# Patient Record
Sex: Female | Born: 1995 | Race: White | Hispanic: No | Marital: Single
Health system: Midwestern US, Community
[De-identification: ages and names within clinical notes are randomized; demographics above are authoritative.]

## PROBLEM LIST (undated history)

## (undated) DIAGNOSIS — R55 Syncope and collapse: Secondary | ICD-10-CM

## (undated) DIAGNOSIS — Z3A33 33 weeks gestation of pregnancy: Secondary | ICD-10-CM

## (undated) DIAGNOSIS — Z3493 Encounter for supervision of normal pregnancy, unspecified, third trimester: Secondary | ICD-10-CM

## (undated) DIAGNOSIS — Z3A24 24 weeks gestation of pregnancy: Secondary | ICD-10-CM

## (undated) DIAGNOSIS — Z3A34 34 weeks gestation of pregnancy: Secondary | ICD-10-CM

## (undated) DIAGNOSIS — R42 Dizziness and giddiness: Secondary | ICD-10-CM

## (undated) DIAGNOSIS — Z3480 Encounter for supervision of other normal pregnancy, unspecified trimester: Secondary | ICD-10-CM

## (undated) DIAGNOSIS — Z3401 Encounter for supervision of normal first pregnancy, first trimester: Secondary | ICD-10-CM

## (undated) DIAGNOSIS — O26893 Other specified pregnancy related conditions, third trimester: Secondary | ICD-10-CM

---
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
## 2018-11-13 NOTE — Telephone Encounter (Signed)
Formatting of this note is different from the original.    Results and recommendations were given. Patient had no further questions or concerns. Results faxed to patients PCP.    ----- Message from Darrin Luis, MD sent 11/09/2018 -----  Please cal the patients and let them know the COVID tests are negative.Also recommend they continue to follow the 5 recommended practices. (hand washing, cough into tissue or elbow, don't touch face, social distancing and stay home if possible).    Thanks    Electronically signed by Alcide Goodness, MA at 11/13/2018  9:45 AM CDT

## 2019-04-10 NOTE — Progress Notes (Signed)
Formatting of this note is different from the original.  CHIEF COMPLAINT:  Chief Complaint   Patient presents with   ? Otalgia     right ear pain in ear and sensitivity x 1 week     SUBJECTIVE:  She presents today for R ear pain for approximately one week. Denies discharge or exudate.   Denies decrease in hearing acuity.   Admits to swimming approximately one week ago.     ROS:  All other review of systems negative, except for those noted.    PROBLEM LIST:  Patient Active Problem List   Diagnosis Code   ? 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 Major depressive disorder, recurrent episode, moderate (HCC) F33.1   ? Generalized anxiety disorder rule out Personality disorder F41.1     MEDICATIONS  Current Outpatient Medications on File Prior to Visit   Medication Sig Dispense Refill   ? methylphenidate 18 MG TB24 ER tablet Take 18 mg by mouth     ? Multiple Vitamins-Minerals (MULTIVITAMIN ADULT PO) Take by mouth daily     ? [DISCONTINUED] phentermine (ADIPEX-P) 37.5 MG tablet Take 37.5 mg by mouth       No current facility-administered medications on file prior to visit.      ALLERGIES:  No Known Allergies    PHYSICAL EXAM:    VITALS:    Vitals:    04/10/19 1028   BP: 114/74   Pulse: 53   Temp: 97.9 F (36.6 C)   TempSrc: Temporal   SpO2: 98%   Weight: (!) 226 lb 12.8 oz (102.9 kg)   Height: 5\' 7"      Constitutional: Well developed, Well nourished, mild acute distress, nontoxic appearance.   HEENT: Normocephalic, Atraumatic, PERRLA, R ear TM fullness, slight redness.   Cardiovascular: Normal heart rate   Thorax & Lungs: Normal breath sounds  Extremities: Intact distal pulses   Neurologic:  No focal deficits noted     ASSESMENT & PLAN:   Patient presents for R ear sensitivity.  No discharge or exudate.  Otitis media.  Rx. Amoxicillin 500 mg twice a day for seven days.   Return if any issues or concerns.      Final Diagnosis:  Encounter Diagnoses   Code Name Primary?   ? H66.91 Right otitis media,  unspecified otitis media type Yes     Visit Orders:  No orders of the defined types were placed in this encounter.    DISCHARGE MEDS:  Outpatient Encounter Medications as of 04/10/2019   Medication Sig Dispense Refill   ? amoxicillin (AMOXIL) 500 MG capsule Take 1 capsule (500 mg) by mouth 2 times daily for 5 days 10 capsule 0   ? amoxicillin (AMOXIL) 500 MG capsule Take 1 capsule (500 mg) by mouth 2 times daily for 7 days 14 capsule 0   ? methylphenidate 18 MG TB24 ER tablet Take 18 mg by mouth     ? Multiple Vitamins-Minerals (MULTIVITAMIN ADULT PO) Take by mouth daily     ? [DISCONTINUED] phentermine (ADIPEX-P) 37.5 MG tablet Take 37.5 mg by mouth       No facility-administered encounter medications on file as of 04/10/2019.        Electronically signed by Haskell Flirt, Ward E, DO at 04/10/2019  2:31 PM CDT
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
## 2022-05-13 NOTE — Telephone Encounter (Signed)
Formatting of this note might be different from the original.  ----- Message from Mort Sawyers, MD sent at 05/12/2022 10:07 PM CDT -----  Labs normal  Electronically signed by Nance Pew, RN at 05/13/2022  8:23 AM CDT

## 2022-08-27 ENCOUNTER — Ambulatory Visit: Admit: 2022-08-27 | Discharge: 2022-08-27 | Payer: BLUE CROSS/BLUE SHIELD | Attending: Family | Primary: Pediatrics

## 2022-08-27 DIAGNOSIS — N912 Amenorrhea, unspecified: Secondary | ICD-10-CM

## 2022-08-27 DIAGNOSIS — Z7689 Persons encountering health services in other specified circumstances: Secondary | ICD-10-CM

## 2022-08-27 NOTE — Progress Notes (Signed)
Krystal Bird is a 27 y.o. female who presents today for her medical conditions/ complaints as noted below. Krystal Bird is c/o of Confirmation and New Patient        HPI  Patient presents today as a new patient for pregnancy confirmation.   Desires Becky for delivery.   LMP 07/27/2022  First pregnancy , +UPT in office  No LMP recorded.  No obstetric history on file.    History reviewed. No pertinent past medical history.  History reviewed. No pertinent surgical history.  History reviewed. No pertinent family history.  Social History     Tobacco Use    Smoking status: Never    Smokeless tobacco: Not on file   Substance Use Topics    Alcohol use: No       No current outpatient medications on file.     No current facility-administered medications for this visit.     No Known Allergies  Vitals:    08/27/22 1450   BP: 133/87   Pulse: 53     Body mass index is 29.93 kg/m.    Review of Systems   Constitutional: Negative.    HENT: Negative.     Eyes: Negative.    Respiratory: Negative.     Cardiovascular: Negative.    Gastrointestinal: Negative.    Endocrine: Negative.    Genitourinary: Negative.  Negative for difficulty urinating, dyspareunia, dysuria, enuresis, frequency, hematuria, menstrual problem, pelvic pain, urgency and vaginal discharge.   Musculoskeletal: Negative.    Skin: Negative.    Allergic/Immunologic: Negative.    Neurological: Negative.    Hematological: Negative.    Psychiatric/Behavioral: Negative.           Physical Exam  Vitals and nursing note reviewed.   Constitutional:       General: She is not in acute distress.     Appearance: She is well-developed. She is not diaphoretic.   HENT:      Head: Normocephalic and atraumatic.   Eyes:      Conjunctiva/sclera: Conjunctivae normal.      Pupils: Pupils are equal, round, and reactive to light.   Pulmonary:      Effort: Pulmonary effort is normal.   Abdominal:      Tenderness: There is no guarding.   Musculoskeletal:         General: Normal range of  motion.      Cervical back: Normal range of motion.      Comments: Normal ROM in all 4 extremities; normal gait   Skin:     General: Skin is warm and dry.   Neurological:      Mental Status: She is alert and oriented to person, place, and time.      Motor: No abnormal muscle tone.      Coordination: Coordination normal.   Psychiatric:         Behavior: Behavior normal.           Diagnosis Orders   1. Encounter to establish care        2. Amenorrhea  POCT urine pregnancy      3. Confirm fetal cardiac activity using ultrasound  US OB TRANSVAGINAL          MEDICATIONS:  No orders of the defined types were placed in this encounter.      ORDERS:  Orders Placed This Encounter   Procedures    US OB TRANSVAGINAL    POCT urine pregnancy       PLAN:  Pregnancy recommendations discussed. Will set up [place] in about 3 weeks, call sooner if spotting or bleeding occurs.   Plan of care was discussed with patient.  Patient was encouraged to adhere to a well-balanced diet, including increasing water intake and limiting excessive caffeine and salt.  The benefits of exercise were discussed; however she was advised against heavy lifting, sit-ups and abdominal crunches.  A list of safe OTC medications was provided and discussed.  The patient was cautioned against the use of tanning beds, hot tubs, saunas, and x-rays. Avoidance of tobacco, alcohol and illicit drugs was also discussed due to harmful effects on the fetus and increased risks associated with pregnancy.  Certain labs and ultrasounds are required at certain times during pregnancy but others are optional, including the serum integrated screen/Harmony/AFP/ Panorama, and other genetic testing.  The patient was encouraged to attend childbirth classes and general hospital information was provided based on patient's hospital of choice.  RTC for nob in 4 weeks   Over 50% of the total visit time of 35 minutes was spent on counseling and/or coordination of care. All questions were answered  and the patient voiced understanding.    There are no Patient Instructions on file for this visit.

## 2022-09-13 ENCOUNTER — Inpatient Hospital Stay: Admit: 2022-09-13 | Payer: BLUE CROSS/BLUE SHIELD | Primary: Pediatrics

## 2022-09-13 ENCOUNTER — Ambulatory Visit: Payer: BLUE CROSS/BLUE SHIELD | Primary: Pediatrics

## 2022-09-13 DIAGNOSIS — Z3689 Encounter for other specified antenatal screening: Secondary | ICD-10-CM

## 2022-09-16 NOTE — Telephone Encounter (Signed)
Spoke to pt per Sherri about pt U/S looks good. There is a little hematoma that is normal to see in early pregnancy. It will go away on it's own. If she were to have bleeding or problems, let us know, Pt verbalized understanding.

## 2022-09-23 ENCOUNTER — Encounter: Admit: 2022-09-23 | Discharge: 2022-09-23 | Payer: BLUE CROSS/BLUE SHIELD | Primary: Pediatrics

## 2022-09-23 ENCOUNTER — Encounter
Admit: 2022-09-23 | Discharge: 2022-09-23 | Payer: BLUE CROSS/BLUE SHIELD | Attending: Women's Health | Primary: Pediatrics

## 2022-09-23 DIAGNOSIS — Z3A08 8 weeks gestation of pregnancy: Secondary | ICD-10-CM

## 2022-09-23 NOTE — Progress Notes (Signed)
Patient presents today for routine prenatal care. Pt denies any vaginal leaking bleeding or contractions.

## 2022-09-23 NOTE — Progress Notes (Signed)
CNM Prenatal Office Note  Subjective:  Krystal Bird is here for a return obstetrical visit. Today she is [redacted]w[redacted]d weeks EGA.  She reports the following:    Problems/complaints today:  none  Objective:  Mother's Prenatal Vitals  BP: 118/64  Weight - Scale: 88 kg (194 lb)  Pulse: 52  Patient Position: Sitting  Prenatal Fetal Information  Fetal HR: 173- u/s  Movement: Absent  Pt is A&Ox3, in no acute distress. Normocephalic, atraumatic. PERRL. Resp even and non-labored. Skin pink, warm & dry. Gravid abdomen. MAE's well. Gait steady.   Assessment:    IUP at [redacted]w[redacted]d wks      Diagnosis Orders   1. [redacted] weeks gestation of pregnancy        2. Encounter for prenatal care of first pregnancy, first trimester  Chlamydia/N. Gonorrhoeae/T.Vaginalis, Urine    Varicella Zoster Antibody, IgG    Herpes simplex virus (HSV) I/II antibodies IgG & IgM w/ reflex    Hepatitis C Antibody    HIV Obstetric Panel    Type and Screen    Culture, Urine        Plan:  Problems/Complaints Management Plan:  none  Routine OB Management Plan:  Pt counseled on  prenatal labs and Genetic testing. Planning next visit.   Continue with routine prenatal care.  RTC in 4 wks for prenatal visit      MEDICATIONS:  No orders of the defined types were placed in this encounter.    ORDERS:  Orders Placed This Encounter   Procedures    Chlamydia/N. Gonorrhoeae/T.Vaginalis, Urine    Culture, Urine    Varicella Zoster Antibody, IgG    Herpes simplex virus (HSV) I/II antibodies IgG & IgM w/ reflex    Hepatitis C Antibody    HIV Obstetric Panel    Type and Screen       More than 50% of this 20 min visit was education and counseling.

## 2022-09-23 NOTE — Progress Notes (Signed)
Patient presents today for routine prenatal care. Pt denies any vaginal leaking bleeding or contractions. + Fetal movement.

## 2022-10-04 ENCOUNTER — Encounter

## 2022-10-04 LAB — CHLAMYDIA/N. GONORRHOEAE/T. VAGINALIS, AMPLIFIED PROBE(UR)
C. trachomatis DNA ,Urine: NOT DETECTED
N. gonorrhoeae DNA, Urine: NOT DETECTED
Trichomonas vaginalis DNA, Urine: NOT DETECTED

## 2022-10-04 LAB — HEPATITIS C ANTIBODY: Hep C Ab Interp: NONREACTIVE

## 2022-10-04 LAB — TYPE AND SCREEN
ABO/Rh: A POS
Antibody Screen: NEGATIVE

## 2022-10-06 LAB — CULTURE, URINE: Urine Culture, Routine: >100000 — AB

## 2022-10-06 LAB — VARICELLA ZOSTER ANTIBODY, IGG: Varicella Zoster Ab IgG: 1.52 (ref >1.09)

## 2022-10-07 ENCOUNTER — Encounter
Admit: 2022-10-07 | Discharge: 2022-10-07 | Payer: BLUE CROSS/BLUE SHIELD | Attending: Women's Health | Primary: Pediatrics

## 2022-10-07 ENCOUNTER — Encounter: Admit: 2022-10-07 | Discharge: 2022-10-07 | Payer: BLUE CROSS/BLUE SHIELD | Primary: Pediatrics

## 2022-10-07 DIAGNOSIS — Z3A10 10 weeks gestation of pregnancy: Secondary | ICD-10-CM

## 2022-10-07 LAB — HIV OBSTETRIC PANEL
Basophils %: 0.4 % (ref 0.0–1.0)
Basophils Absolute: 0 10*3/uL (ref 0.00–0.20)
Eosinophils %: 0.9 % (ref 0.0–5.0)
Eosinophils Absolute: 0.1 10*3/uL (ref 0.00–0.60)
HIV-1 P24 Ag: NONREACTIVE
Hematocrit: 42.3 % (ref 37.0–47.0)
Hemoglobin: 14.3 g/dL (ref 12.0–16.0)
Hep B S Ag Interp: NONREACTIVE
Immature Granulocytes #: 0 10*3/uL
Lymphocytes %: 17.3 % — ABNORMAL LOW (ref 20.0–40.0)
Lymphocytes Absolute: 1.8 10*3/uL (ref 1.1–4.5)
MCH: 32.9 pg — ABNORMAL HIGH (ref 27.0–31.0)
MCHC: 33.8 g/dL (ref 33.0–37.0)
MCV: 97.2 fL (ref 81.0–99.0)
MPV: 11.2 fL (ref 9.4–12.3)
Monocytes %: 5.3 % (ref 0.0–10.0)
Monocytes Absolute: 0.6 10*3/uL (ref 0.00–0.90)
Neutrophils %: 75.7 % — ABNORMAL HIGH (ref 50.0–65.0)
Neutrophils Absolute: 8 10*3/uL — ABNORMAL HIGH (ref 1.5–7.5)
Platelets: 207 10*3/uL (ref 130–400)
RBC: 4.35 M/uL (ref 4.20–5.40)
RDW: 12.2 % (ref 11.5–14.5)
RPR: NONREACTIVE
Rapid HIV 1&2: NONREACTIVE
Rubella Antibody IgG: REACTIVE
WBC: 10.5 10*3/uL (ref 4.8–10.8)

## 2022-10-07 MED ORDER — METRONIDAZOLE 0.75 % VA GEL
0.75 | Freq: Every day | VAGINAL | 0 refills | Status: AC
Start: 2022-10-07 — End: 2022-10-12

## 2022-10-07 NOTE — Progress Notes (Unsigned)
Patient presents today for routine prenatal visit. Pt denies any vaginal leaking bleeding or contractions.   Patient desires genetic testing today

## 2022-10-07 NOTE — Patient Instructions (Signed)
Patient Education        Weeks 6 to 10 of Your Pregnancy: Care Instructions  During these weeks of pregnancy, your body goes through many changes. You may start to feel different, both in your body and your emotions. Each pregnancy is different, so there's no "right" way to feel. These early weeks are a time to make healthy choices for you and your pregnancy.    Take a daily prenatal vitamin. Choose one with folic acid in it.   Avoid alcohol, tobacco, and drugs (including marijuana). If you need help quitting, talk to your doctor.     Drink plenty of liquids.  Be sure to drink enough water. And limit sodas, other sweetened drinks, and caffeine.     Choose foods that are good sources of calcium, iron, and folate.  You can try dairy products, dark leafy greens, fortified orange juice and cereals, almonds, broccoli, dried fruit, and beans.     Avoid foods that may be harmful.  Don't eat raw meat, deli meat, raw seafood, or raw eggs. Avoid soft cheese and unpasteurized dairy, like Brie and blue cheese. And don't eat fish that contains a lot of mercury, like shark and swordfish.     Don't touch kitty litter or cat poop.  They can cause an infection that could be harmful during pregnancy.     Avoid things that can make your body too hot.  For example, avoid hot tubs and saunas.     Soothe morning sickness.  Try eating 5 or 6 small meals a day, getting some fresh air, or using ginger to control symptoms.     Ask your doctor about flu and COVID-19 shots.  Getting them can help protect against infection.   Follow-up care is a key part of your treatment and safety. Be sure to make and go to all appointments, and call your doctor if you are having problems. It's also a good idea to know your test results and keep a list of the medicines you take.  Where can you learn more?  Go to https://www.bennett.info/ and enter G112 to learn more about "Weeks 6 to 10 of Your Pregnancy: Care Instructions."  Current as of: February 23, 2022               Content Version: 13.9   2006-2023 [employer], Incorporated.   Care instructions adapted under license by [facility]. If you have questions about a medical condition or this instruction, always ask your healthcare professional. [place] any warranty or liability for your use of this information.

## 2022-10-07 NOTE — Progress Notes (Unsigned)
CNM Prenatal Office Note  Subjective:  Krystal Bird is here for a return obstetrical visit. Today she is [redacted]w[redacted]d weeks EGA.  She reports the following:    Problems/complaints today:  None  Objective:  Mother's Prenatal Vitals  BP: 113/71  Weight - Scale: 88.9 kg (196 lb)  Pulse: 51  Patient Position: Sitting  Prenatal Fetal Information  Movement: Absent  Pt is A&Ox3, in no acute distress. Normocephalic, atraumatic. PERRL. Resp even and non-labored. Skin pink, warm & dry. Gravid abdomen. MAE's well. Gait steady.   Assessment:    IUP at [redacted]w[redacted]d wks      Diagnosis Orders   1. [redacted] weeks gestation of pregnancy        2. Encounter for prenatal care of first pregnancy, first trimester          Plan:  Problems/Complaints Management Plan:  None  Routine OB Management Plan:  Pt counseled on  PNL reviewed and Genetic testing  Continue with routine prenatal care.  RTC in 4 wks for prenatal visit      MEDICATIONS:  No orders of the defined types were placed in this encounter.    ORDERS:  No orders of the defined types were placed in this encounter.      More than 50% of this 20 min visit was education and counseling.      I Edison Simon, LPN, am scribing for and in the presence of Antony Salmon, [place]  10/07/2022 10:49 AM

## 2022-10-08 LAB — HERPES SIMPLEX VIRUS (HSV) I/II ANTIBODIES IGG & IGM W/ REFLEX
Herpes Type 1/2 IgM Combined: 0.3 IV (ref <=0.89)
Herpes Type I/II IgG Combined: 0.26 IV

## 2022-11-04 ENCOUNTER — Encounter: Payer: BLUE CROSS/BLUE SHIELD | Attending: Women's Health | Primary: Pediatrics

## 2022-11-04 ENCOUNTER — Encounter
Admit: 2022-11-04 | Discharge: 2022-11-04 | Payer: BLUE CROSS/BLUE SHIELD | Attending: Women's Health | Primary: Pediatrics

## 2022-11-04 ENCOUNTER — Encounter: Admit: 2022-11-04 | Discharge: 2022-11-04 | Payer: BLUE CROSS/BLUE SHIELD | Primary: Pediatrics

## 2022-11-04 DIAGNOSIS — Z3A14 14 weeks gestation of pregnancy: Secondary | ICD-10-CM

## 2022-11-04 MED ORDER — SERTRALINE HCL 50 MG PO TABS
50 MG | ORAL_TABLET | Freq: Every day | ORAL | 5 refills | Status: AC
Start: 2022-11-04 — End: 2023-01-13

## 2022-11-04 MED ORDER — HYDROXYZINE PAMOATE 25 MG PO CAPS
25 MG | ORAL_CAPSULE | Freq: Three times a day (TID) | ORAL | 1 refills | Status: AC | PRN
Start: 2022-11-04 — End: 2022-12-04

## 2022-11-04 NOTE — Patient Instructions (Addendum)
Patient Education        Weeks 14 to 18 of Your Pregnancy: Care Instructions  Around this time, you may start to look pregnant. Your baby is now able to pass urine. And the first stool (meconium) is starting to collect in your baby's intestines. Hair is starting to grow on your baby's head.    You may notice some skin changes, such as itchy spots on your palms or acne on your face.    At your next doctor visit, you may have an ultrasound. So you might think about whether you want to know the sex of your baby. Also ask your doctor about flu and COVID-19 shots.     How to reduce stress   Ask for help when you need it.  Try to avoid things that cause you stress.  Seek out things that relieve stress, such as breathing exercises or yoga.     How to get exercise   If you don't usually exercise, start slowly. Short walks may be a good choice.  Try to be active 30 minutes a day, at least 5 days a week.  Avoid activities where you're more likely to fall.  Use light weights to reduce stress on your joints.     How to stay at a healthy weight for you   Talk to your doctor or midwife about how much weight you should gain.  It's generally best to gain:  About 28 to 40 pounds if you're underweight.  About 25 to 35 pounds if you're at a healthy weight.  About 15 to 25 pounds if you're overweight.  About 11 to 20 pounds if you're very overweight (obese).  Follow-up care is a key part of your treatment and safety. Be sure to make and go to all appointments, and call your doctor if you are having problems. It's also a good idea to know your test results and keep a list of the medicines you take.  Where can you learn more?  Go to https://www.bennett.info/ and enter I453 to learn more about "Weeks 14 to 18 of Your Pregnancy: Care Instructions."  Current as of: February 22, 2022               Content Version: 14.0   2006-2024 Healthwise, Incorporated.   Care instructions adapted under license by [facility]. If you have  questions about a medical condition or this instruction, always ask your healthcare professional. [place] any warranty or liability for your use of this information.       Patient Education        Learning About Dental Care During Pregnancy  Why is dental care important during pregnancy?  It's important to take care of your body when you are pregnant. This includes your teeth and gums. A healthy mouth--and good dental habits--will help you and your baby.  Taking care of your teeth while you are pregnant helps prevent cavities and other dental problems. Brush, floss, and try to limit sugary foods and drinks. Getting the right vitamins and nutrients is good for your baby's teeth, which begin to form before birth. And remember that it's safe--and a good idea--to visit the dentist during your pregnancy.  What changes in your mouth during pregnancy?  Some changes in your mouth during pregnancy are normal and should go away after your baby is born.  You have more blood flow to the mucous membranes of the mouth and gums when you are pregnant. This may cause bleeding  in your gums, especially when you brush your teeth.  Your gums may be more swollen and tender than usual. Try using a toothbrush with soft bristles.  Your teeth might feel a little loose. This usually does not cause any problems and goes away after pregnancy.  If you have morning sickness or digestive problems like reflux, stomach acid in your mouth can weaken your teeth. This may make them feel more sensitive and makes cavities more likely. Regular brushing can help. Keep brushing gently, even if your teeth feel more sensitive or your gums bleed.  How can you care for your teeth during pregnancy?  Keep brushing your teeth twice a day and try to floss once a day. It's fine to use toothpaste with fluoride in it while you are pregnant. Fluoride helps prevent tooth decay.  If you gag when brushing, try using a toothbrush with a smaller  head or toothpaste that doesn't foam when you brush.  Eat a balanced, nutritious diet and get the right amount of vitamins and minerals. You may have cravings for sugary snacks and drinks, but too much sugar can lead to cavities.  If you have reflux or morning sickness, rinse out your mouth. Try rinsing with a mixture of one teaspoon of baking soda in a cup of water. Wait at least 20 minutes before brushing. You might also ask your doctor if you can take over-the-counter medicine for reflux. Be safe with medicines. Read and follow all instructions on the label.  Do not smoke when you are pregnant or allow others to smoke around you. If you need help quitting, talk to your doctor about stop-smoking programs and medicines. These can increase your chances of quitting for good.  Regular visits to your dentist during pregnancy are important to prevent problems.  Tell your dentist that you are pregnant.  Dental X-rays and local anesthesia are generally safe during pregnancy.  Most dental work can be done while you are pregnant. If you go to the dentist during the second or third trimesters, you may be more comfortable sitting in a different position in the dental chair.  Delaying dental care can make a problem worse.  Follow-up care is a key part of your treatment and safety. Be sure to make and go to all appointments, and call your doctor if you are having problems. It's also a good idea to know your test results and keep a list of the medicines you take.  Where can you learn more?  Go to https://www.bennett.info/ and enter G168 to learn more about "Learning About Dental Care During Pregnancy."  Current as of: March 21, 2022               Content Version: 14.0   2006-2024 Healthwise, Incorporated.   Care instructions adapted under license by [facility]. If you have questions about a medical condition or this instruction, always ask your healthcare professional. [place] any  warranty or liability for your use of this information.

## 2022-11-04 NOTE — Progress Notes (Signed)
CNM Prenatal Office Note  Subjective:  Krystal Bird is here for a return obstetrical visit. Today she is [redacted]w[redacted]d weeks EGA.  She reports the following:    Problems/complaints today:  Anxiety  Objective:  Mother's Prenatal Vitals  BP: 137/83  Weight - Scale: 93.9 kg (207 lb)  Pulse: 57  Patient Position: Sitting  Prenatal Fetal Information  Fetal HR: 148- u/s  Movement: Absent  Pt is A&Ox3, in no acute distress. Normocephalic, atraumatic. PERRL. Resp even and non-labored. Skin pink, warm & dry. Gravid abdomen. MAE's well. Gait steady.   Assessment:    IUP at [redacted]w[redacted]d wks      Diagnosis Orders   1. [redacted] weeks gestation of pregnancy  hydrOXYzine pamoate (VISTARIL) 25 MG capsule      2. Encounter for prenatal care of first pregnancy, second trimester        3. Anxiety during pregnancy  hydrOXYzine pamoate (VISTARIL) 25 MG capsule    sertraline (ZOLOFT) 50 MG tablet        Plan:  Problems/Complaints Management Plan:  Start vistaril as needed and zoloft daily  Routine OB Management Plan:  Pt counseled on  schedule Anatomy   Continue with routine prenatal care.  RTC in 2 wks for prenatal visit and med f/u      MEDICATIONS:  Orders Placed This Encounter   Medications    hydrOXYzine pamoate (VISTARIL) 25 MG capsule     Sig: Take 1 capsule by mouth 3 times daily as needed for Itching     Dispense:  30 capsule     Refill:  1    sertraline (ZOLOFT) 50 MG tablet     Sig: Take 1 tablet by mouth daily     Dispense:  30 tablet     Refill:  5     ORDERS:  No orders of the defined types were placed in this encounter.      More than 50% of this 20 min visit was education and counseling.      I Corlis Hove, LPN, am scribing for and in the presence of Helene Shoe, [place]  11/04/22  3:51 PM CDT   I have seen and examined the patient independently.  I reviewed all laboratory and imaging studies that are relevant.  I have reviewed and made any appropriate changes to the HPI.    Electronically signed by Helene Shoe, APRN - CNM on  11/04/22  at 7:42 PM CDT

## 2022-11-04 NOTE — Progress Notes (Signed)
Pt presents today for routine prenatal visit. Pt denies vaginal bleeding, cramping, or leaking of fluid +fetal movement.

## 2022-11-18 ENCOUNTER — Inpatient Hospital Stay: Admit: 2022-11-18 | Payer: BLUE CROSS/BLUE SHIELD | Primary: Pediatrics

## 2022-11-18 ENCOUNTER — Encounter
Admit: 2022-11-18 | Discharge: 2022-11-18 | Payer: BLUE CROSS/BLUE SHIELD | Attending: Women's Health | Primary: Pediatrics

## 2022-11-18 DIAGNOSIS — Z3A16 16 weeks gestation of pregnancy: Secondary | ICD-10-CM

## 2022-11-18 MED ORDER — HYDROXYZINE HCL 10 MG PO TABS
10 | ORAL_TABLET | Freq: Every evening | ORAL | 1 refills | Status: AC | PRN
Start: 2022-11-18 — End: 2022-11-28

## 2022-11-18 NOTE — Progress Notes (Signed)
CNM Prenatal Office Note  Subjective:  Krystal Bird is here for a return obstetrical visit. Today she is [redacted]w[redacted]d weeks EGA.   She is taking her prenatal vitamins and is aware of nutrition needs. She reports the following:    Problems/complaints today:  None  Objective:  Mother's Prenatal Vitals  BP: 134/73  Weight - Scale: 93.9 kg (207 lb)  Pulse: 52  Patient Position: Sitting  Prenatal Fetal Information  Fetal HR: [place]  Movement: Absent  Pt is A&Ox3, in no acute distress. Normocephalic, atraumatic. PERRL. Resp even and non-labored. Skin pink, warm & dry. Gravid abdomen. MAE's well. Gait steady.   Assessment:    IUP at [redacted]w[redacted]d wks      Diagnosis Orders   1. [redacted] weeks gestation of pregnancy  US OB 1 OR MORE FETUS LIMITED    US OB 1 OR MORE FETUS LIMITED      2. Encounter for prenatal care of first pregnancy, second trimester  US OB 1 OR MORE FETUS LIMITED    US OB 1 OR MORE FETUS LIMITED      3. Anxiety during pregnancy  hydrOXYzine HCl (ATARAX) 10 MG tablet      4. Presence of fetal heart activity in second trimester          Plan:  Problems/complaints Management Plan:  None  Routine OB Management Plan:  Pt counseled on balanced nutrition, adequate fluid intake, taking PNV daily, and exercise along with  anatomy scheduled  Continue with routine prenatal care.  RTC in 4 wks for prenatal visit.      MEDICATIONS:  Orders Placed This Encounter   Medications    hydrOXYzine HCl (ATARAX) 10 MG tablet     Sig: Take 1 tablet by mouth nightly as needed for Itching (anxiety)     Dispense:  30 tablet     Refill:  1     ORDERS:  Orders Placed This Encounter   Procedures    US OB 1 OR MORE FETUS LIMITED       More than 50% of this 20 min visit was education and counseling.        I Edison Simon, LPN, am scribing for and in the presence of Antony Salmon, [place]  11/18/22  10:15 AM CDT .  I have seen and examined the patient independently.  I reviewed all laboratory and imaging studies that are relevant.  I have reviewed and made any  appropriate changes to the HPI.    Electronically signed by Antony Salmon, APRN - CNM on 11/19/22  at 7:52 PM CDT

## 2022-11-18 NOTE — Progress Notes (Unsigned)
Pt denies vaginal leaking, bleeding or contractions. She needs a doctors not for work about her anxiety to be able to work from home. Lower the dosage on her as needed medication. Almost passed out yesterday. Going on a cruise and needs a note stating it is safe for her to go, per the cruise line.

## 2022-11-18 NOTE — Patient Instructions (Signed)
Patient Education        Weeks 14 to 18 of Your Pregnancy: Care Instructions  Around this time, you may start to look pregnant. Your baby is now able to pass urine. And the first stool (meconium) is starting to collect in your baby's intestines. Hair is starting to grow on your baby's head.    You may notice some skin changes, such as itchy spots on your palms or acne on your face.    At your next doctor visit, you may have an ultrasound. So you might think about whether you want to know the sex of your baby. Also ask your doctor about flu and COVID-19 shots.     How to reduce stress   Ask for help when you need it.  Try to avoid things that cause you stress.  Seek out things that relieve stress, such as breathing exercises or yoga.     How to get exercise   If you don't usually exercise, start slowly. Short walks may be a good choice.  Try to be active 30 minutes a day, at least 5 days a week.  Avoid activities where you're more likely to fall.  Use light weights to reduce stress on your joints.     How to stay at a healthy weight for you   Talk to your doctor or midwife about how much weight you should gain.  It's generally best to gain:  About 28 to 40 pounds if you're underweight.  About 25 to 35 pounds if you're at a healthy weight.  About 15 to 25 pounds if you're overweight.  About 11 to 20 pounds if you're very overweight (obese).  Follow-up care is a key part of your treatment and safety. Be sure to make and go to all appointments, and call your doctor if you are having problems. It's also a good idea to know your test results and keep a list of the medicines you take.  Where can you learn more?  Go to [employer]https://www.healthwise.net/patientEd and enter I453 to learn more about "Weeks 14 to 18 of Your Pregnancy: Care Instructions."  Current as of: February 22, 2022               Content Version: 14.0   2006-2024 Healthwise, Incorporated.   Care instructions adapted under license by [facility]. If you have  questions about a medical condition or this instruction, always ask your healthcare professional. Healthwise, Incorporated disclaims any warranty or liability for your use of this information.       Patient Education        Weeks 14 to 18 of Your Pregnancy: Care Instructions  Around this time, you may start to look pregnant. Your baby is now able to pass urine. And the first stool (meconium) is starting to collect in your baby's intestines. Hair is starting to grow on your baby's head.    You may notice some skin changes, such as itchy spots on your palms or acne on your face.    At your next doctor visit, you may have an ultrasound. So you might think about whether you want to know the sex of your baby. Also ask your doctor about flu and COVID-19 shots.     How to reduce stress   Ask for help when you need it.  Try to avoid things that cause you stress.  Seek out things that relieve stress, such as breathing exercises or yoga.     How to get  exercise   If you don't usually exercise, start slowly. Short walks may be a good choice.  Try to be active 30 minutes a day, at least 5 days a week.  Avoid activities where you're more likely to fall.  Use light weights to reduce stress on your joints.     How to stay at a healthy weight for you   Talk to your doctor or midwife about how much weight you should gain.  It's generally best to gain:  About 28 to 40 pounds if you're underweight.  About 25 to 35 pounds if you're at a healthy weight.  About 15 to 25 pounds if you're overweight.  About 11 to 20 pounds if you're very overweight (obese).  Follow-up care is a key part of your treatment and safety. Be sure to make and go to all appointments, and call your doctor if you are having problems. It's also a good idea to know your test results and keep a list of the medicines you take.  Where can you learn more?  Go to [employer] and enter I453 to learn more about "Weeks 14 to 18 of Your Pregnancy: Care  Instructions."  Current as of: February 22, 2022               Content Version: 14.0   2006-2024 Healthwise, Incorporated.   Care instructions adapted under license by [facility]. If you have questions about a medical condition or this instruction, always ask your healthcare professional. Healthwise, Incorporated disclaims any warranty or liability for your use of this information.

## 2022-12-07 ENCOUNTER — Encounter

## 2022-12-07 NOTE — Unmapped (Signed)
Formatting of this note might be different from the original.  ED Provider Screening Note    HPI: Krystal G1P0 [redacted] weeks gestation presents with dizziness.   Patient reports extreme dizziness and fatigue since last night around 1800 but intermittently over the last week.   Associated nausea without vomiting. Decreased oral intake.     Denies abdominal pain, denies vaginal bleeding.     Focused PE:    Physical Exam  Vitals and nurse triage note reviewed. Patient is ambulatory.  Constitutional:       General: She is not in acute distress.     Comments: Tired in triage, resting with eyes closed. Dry mucus membranes    HENT:      Mouth/Throat:      Pharynx: No posterior oropharyngeal erythema.   Eyes:      Pupils: Pupils are equal, round, and reactive to light.   Cardiovascular:      Rate and Rhythm: Normal rate.   Pulmonary:      Effort: Pulmonary effort is normal. No respiratory distress.   Abdominal:      Tenderness: There is no abdominal tenderness.   Musculoskeletal:         General: No tenderness.   Neurological:      Mental Status: She is alert and oriented to person, place, and time.     Plan:    A brief evaluation was performed in triage to expedite medical care for the patient. Appropriate initial labs, imaging and nursing interventions will be ordered.      Electronically signed by Wesley Blas, PA at 12/07/2022  4:11 PM CDT

## 2022-12-07 NOTE — ED Triage Notes (Signed)
Formatting of this note might be different from the original.  Pt arrives complaining of dizziness/fatigue that started last night.  "I am so nauseous its been stunting my appetite."  Pt is [redacted] weeks pregnant. Resp even and non labored. Skin pwd.  Electronically signed by Gigi Gin, RN at 12/07/2022  4:07 PM CDT

## 2022-12-07 NOTE — ED Provider Notes (Signed)
Formatting of this note is different from the original.    HPI     Chief Complaint   Patient presents with    Dizziness    Pregnancy Problem     Well-appearing female with no significant past medical history is G1P0 19 weeks' gestation last ultrasound on 04/04 she had a small subchorionic hemorrhage.  She has no pregnancy-related complaints no abdominal pelvic pain no abnormal urine symptoms no cramping no flank pain.  Vaginal bleeding.  No vaginal discharge.  No abnormal urine symptoms it she is here for the past 3 weeks with the exception of just a few days she is experiencing fatigue lightheadedness nausea.  Intermittent mild headaches no chest pain no shortness a breath occasional palpitations no difficulty breathing no hemoptysis no cough no wheezing no upper respiratory symptoms.  No ear pain or drainage.  She had been taking anything for nausea there has no vomiting.  She had had any fever chills.  She follows with OB in [place].  She has been contacting them on the phone on and off.  She has no previous cardiovascular problems she has not had any syncope or near-syncope.  She does state that sometimes she "feels like I could pass out"    Dizziness  Associated symptoms: headaches and nausea    Associated symptoms: no chest pain, no diarrhea, no palpitations, no shortness of breath, no vomiting and no weakness    Pregnancy Problem  Associated symptoms: fatigue, headaches and nausea    Associated symptoms: no abdominal pain, no chest pain, no cough, no diarrhea, no fever, no shortness of breath, no vomiting and no wheezing      Signed Nursing Notes       Gigi Gin, RN 12/07/2022 16:07             Pt arrives complaining of dizziness/fatigue that started last night.  "I am so nauseous its been stunting my appetite."  Pt is [redacted] weeks pregnant. Resp even and non labored. Skin pwd.         Patient History     Past Medical History:   Diagnosis Date    Depression     GAD (generalized anxiety disorder) 11/01/2015     Obesity      No past medical history pertinent negatives.    Past Surgical History:   Procedure Laterality Date    NO PAST SURGERIES  11/06/2019     Family History   Problem Relation Age of Onset    Heart defect Sister     Cancer Maternal Grandmother     Diabetes Neg Hx      Social History     Tobacco Use    Smoking status: Former     Types: Cigarettes     Quit date: 09/2015     Years since quitting: 7.2    Smokeless tobacco: Never   Vaping Use    Vaping Use: Never used   Substance Use Topics    Alcohol use: Yes     Comment: occasional    Drug use: No      Review of Systems     Review of Systems   Constitutional:  Positive for fatigue. Negative for activity change, appetite change, chills, diaphoresis and fever.   HENT: Negative.     Eyes:  Negative for photophobia and visual disturbance.   Respiratory:  Negative for cough, choking, chest tightness, shortness of breath and wheezing.    Cardiovascular:  Negative for chest pain and palpitations.  Gastrointestinal:  Positive for nausea. Negative for abdominal pain, constipation, diarrhea and vomiting.   Genitourinary: Negative.    Musculoskeletal:  Negative for arthralgias, back pain, gait problem, neck pain and neck stiffness.   Neurological:  Positive for dizziness and headaches. Negative for seizures, syncope, speech difficulty, weakness, light-headedness and numbness.   Psychiatric/Behavioral:  Negative for agitation and confusion. The patient is not nervous/anxious.       Physical Exam     ED Triage Vitals [12/07/22 1608]   Temperature Heart Rate Resp Rate Blood Pressure Pulse Oximetry   36.5 C (97.7 F) 64 16 122/79 100 %     Temp Source Heart Rate Source Patient Position BP Location FiO2 (%)   Oral Monitor Sitting Left arm --     Physical Exam  Constitutional:       General: She is not in acute distress.     Appearance: Normal appearance. She is normal weight. She is not ill-appearing, toxic-appearing or diaphoretic.   HENT:      Head: Normocephalic and  atraumatic.      Nose: Nose normal.      Mouth/Throat:      Mouth: Mucous membranes are moist.   Eyes:      Conjunctiva/sclera: Conjunctivae normal.   Cardiovascular:      Rate and Rhythm: Normal rate and regular rhythm.   Pulmonary:      Effort: Pulmonary effort is normal.      Breath sounds: Normal breath sounds.   Abdominal:      General: There is distension.      Tenderness: There is no abdominal tenderness. There is no guarding.   Musculoskeletal:         General: No tenderness.      Right lower leg: No edema.      Left lower leg: No edema.   Skin:     Coloration: Skin is not pale.      Findings: No rash.   Neurological:      General: No focal deficit present.      Mental Status: She is alert and oriented to person, place, and time. Mental status is at baseline.      Cranial Nerves: No cranial nerve deficit.      Sensory: No sensory deficit.      Motor: No weakness.      Coordination: Coordination normal.      Gait: Gait normal.   Psychiatric:         Mood and Affect: Mood normal.         Behavior: Behavior normal.         Thought Content: Thought content normal.         Judgment: Judgment normal.      ED Course & MDM     Labs Reviewed   CBC AND DIFFERENTIAL    Narrative:     The following orders were created for panel order CBC and differential.  Procedure                               Abnormality         Status                     ---------                               -----------         ------  CBC Automated[[id number]]                                                                 Please view results for these tests on the individual orders.   CMP   MAGNESIUM   LIPASE   URINALYSIS, CULTURE IF INDICATED   CBC AUTOMATED   HCG, QUANTITATIVE       No results found.    No orders to display     EKG    (Results Pending)     Medications   ondansetron ODT (ZOFRAN-ODT) disintegrating tablet 4 mg (has no administration in time range)                 Medical Decision Making  LIGHTHEADEDNESS NAUSEA  FATIGUE AND OCCASIONAL VERY MILD HEADACHE BASICALLY FOR THE PAST 3 WEEKS OFF AND ON SHE LOOKS WELL HER WORKUP WAS REASSURING HER FETAL HEART TONES ARE AROUND 160 SHE WAS NOT SHE HAD A SLIGHTLY LOW MAGNESIUM AND CALCIUM LEVEL I DO NOT THINK THAT IS CONTRIBUTED I THINK THESE ARE JUST THE PARENTS OF 2ND TRIMESTER PREGNANCY SHE IS GOING TO FOLLOW-UP WITH OB WITHIN NEXT WEEK TO HAVE HER ELECTROLYTES RECHECKED.    Risk  OTC drugs.  Prescription drug management.    New Prescriptions    No medications on file     ED Course as of 12/07/22 1856   Tue Dec 07, 2022   1753 WELL-APPEARING PATIENT DID FETAL HEART TONES PULSE RATE ABOUT 60 THE BABY IS MOVING.  IT WAS AT BEDSIDE ULTRASOUND.  THE PATIENT HAS NO COMPLAINTS SHE LOOKS WELL HER SIGNS ABLE. [JD]   1844 Beata-hCG,Quantitative: 16,320.0 [JD]     ED Course User Index  [JD] Bard Herbert, PA       Clinical Impression: as of 12/07/22 1856   Second trimester pregnancy   Low calcium levels   Low magnesium level           Bard Herbert, [place]  12/07/22 1856    Electronically signed by Leveda Anna, MD at 12/08/2022  8:04 AM CDT

## 2022-12-10 ENCOUNTER — Encounter

## 2022-12-10 LAB — CBC WITH AUTO DIFFERENTIAL
Basophils %: 0.3 % (ref 0.0–1.0)
Basophils Absolute: 0 10*3/uL (ref 0.00–0.20)
Eosinophils %: 1.1 % (ref 0.0–5.0)
Eosinophils Absolute: 0.1 10*3/uL (ref 0.00–0.60)
Hematocrit: 38.8 % (ref 37.0–47.0)
Hemoglobin: 13.4 g/dL (ref 12.0–16.0)
Immature Granulocytes #: 0.1 10*3/uL
Lymphocytes %: 13.2 % — ABNORMAL LOW (ref 20.0–40.0)
Lymphocytes Absolute: 1.5 10*3/uL (ref 1.1–4.5)
MCH: 33.3 pg — ABNORMAL HIGH (ref 27.0–31.0)
MCHC: 34.5 g/dL (ref 33.0–37.0)
MCV: 96.3 fL (ref 81.0–99.0)
MPV: 11.2 fL (ref 9.4–12.3)
Monocytes %: 5.4 % (ref 0.0–10.0)
Monocytes Absolute: 0.6 10*3/uL (ref 0.00–0.90)
Neutrophils %: 79.2 % — ABNORMAL HIGH (ref 50.0–65.0)
Neutrophils Absolute: 9.1 10*3/uL — ABNORMAL HIGH (ref 1.5–7.5)
Platelets: 177 10*3/uL (ref 130–400)
RBC: 4.03 M/uL — ABNORMAL LOW (ref 4.20–5.40)
RDW: 12.4 % (ref 11.5–14.5)
WBC: 11.5 10*3/uL — ABNORMAL HIGH (ref 4.8–10.8)

## 2022-12-16 ENCOUNTER — Encounter
Admit: 2022-12-16 | Discharge: 2022-12-16 | Payer: BLUE CROSS/BLUE SHIELD | Attending: Women's Health | Primary: Pediatrics

## 2022-12-16 DIAGNOSIS — Z3A20 20 weeks gestation of pregnancy: Secondary | ICD-10-CM

## 2022-12-16 NOTE — Patient Instructions (Signed)
Patient Education        Weeks 18 to 22 of Your Pregnancy: Care Instructions  At this stage you may find that your nausea and fatigue are gone. You may feel better overall and have more energy. But you might now also have some new discomforts, like sleep problems or leg cramps.    You may start to feel your baby move. These movements can feel like butterflies or bubbles.    Babies at this stage can now suck their thumbs.    Get some exercise every day.  And avoid caffeine late in the day.     Take a warm shower or bath before bed.  Try relaxation exercises to calm your mind and body.     Use extra pillows.  They can help you get comfortable.     Don't use sleeping pills or alcohol.  They could harm your baby.     For leg cramps, stretch and apply heat.  A warm bath, leg warmers, a heating pad, or a hot water bottle can help with muscle aches.   Stretches for leg cramps    Straighten your leg and bend your foot (flex your ankle) slowly upward, toward your knee. Bend your toes up and down.    Stand on a flat surface. Stretch your toes upward. For balance, hold on to the wall or something stable. If it feels okay, take small steps walking on your heels.  Follow-up care is a key part of your treatment and safety. Be sure to make and go to all appointments, and call your doctor if you are having problems. It's also a good idea to know your test results and keep a list of the medicines you take.  Where can you learn more?  Go to https://www.healthwise.net/patientEd and enter W603 to learn more about "Weeks 18 to 22 of Your Pregnancy: Care Instructions."  Current as of: February 22, 2022               Content Version: 14.0   2006-2024 Healthwise, Incorporated.   Care instructions adapted under license by [facility]. If you have questions about a medical condition or this instruction, always ask your healthcare professional. Healthwise, Incorporated disclaims any warranty or liability for your use of this information.        Patient Education        Nutrition During Pregnancy: Care Instructions  Overview     Healthy eating when you are pregnant is important for you and your baby. It can help you feel well and have a successful pregnancy and delivery. During pregnancy your nutrition needs increase. Even if you have excellent eating habits, your doctor may recommend a multivitamin to make sure you get enough iron and folic acid.  You may wonder how much weight you should gain. In general, if you were at a healthy weight before you became pregnant, then you should gain between 25 and 35 pounds. If you were overweight before pregnancy, then you'll likely be advised to gain 15 to 25 pounds. If you were underweight before pregnancy, then you'll probably be advised to gain 28 to 40 pounds. Your doctor will work with you to set a weight goal that is right for you. Gaining a healthy amount of weight helps you have a healthy baby.  Follow-up care is a key part of your treatment and safety. Be sure to make and go to all appointments, and call your doctor if you are having problems. It's also a   good idea to know your test results and keep a list of the medicines you take.  How can you care for yourself at home?  Eat plenty of fruits and vegetables. Include a variety of orange, yellow, and leafy dark-green vegetables every day.  Choose whole-grain bread, cereal, and pasta. Good choices include whole wheat bread, whole wheat pasta, brown rice, and oatmeal.  Get 4 or more servings of milk and milk products each day. Good choices include nonfat or low-fat milk, yogurt, and cheese. If you cannot eat milk products, you can get calcium from calcium-fortified products such as orange juice, soy milk, and tofu. Other non-milk sources of calcium include leafy green vegetables, such as broccoli, kale, mustard greens, turnip greens, bok choy, and brussels sprouts.  If you eat meat, pick lower-fat types. Good choices include lean cuts of meat and chicken or  turkey without the skin.  Avoid fish that are high in mercury. These include shark, swordfish, king mackerel, marlin, orange roughy, and bigeye tuna, as well as tilefish from the [place].  It's okay to eat up to 8 to 12 ounces a week of fish that are low in mercury or up to 4 ounces a week of fish that have medium levels of mercury. Some fish that are low in mercury are salmon, shrimp, canned light tuna, cod, and tilapia. Some fish that have medium levels of mercury are halibut and white albacore tuna.  For more advice about eating fish, you can visit the U.S. Food and Drug Administration (FDA) or U.S. Environmental Protection Agency (EPA) website.  Heat lunch meats (such as turkey, ham, or bologna) to 165F before you eat them. This reduces your risk of getting sick from a kind of bacteria that can be found in lunch meats.  Do not eat unpasteurized soft cheeses, such as brie, feta, fresh mozzarella, and blue cheese. They have a bacteria that could harm your baby.  Limit caffeine to about 200 to 300 mg per day. On average, a cup of brewed coffee has around 80 to 100 mg of caffeine.  Do not drink any alcohol. No amount of alcohol has been found to be safe during pregnancy.  Do not diet or try to lose weight. For example, do not follow a low-carbohydrate diet. If you are overweight at the start of your pregnancy, your doctor will work with you to manage your weight gain.  Tell your doctor about all vitamins and supplements you take.  When should you call for help?  Watch closely for changes in your health, and be sure to contact your doctor if you have any problems.  Where can you learn more?  Go to https://www.healthwise.net/patientEd and enter Y785 to learn more about "Nutrition During Pregnancy: Care Instructions."  Current as of: May 05, 2022               Content Version: 14.0   2006-2024 Healthwise, Incorporated.   Care instructions adapted under license by [facility]. If you have questions about  a medical condition or this instruction, always ask your healthcare professional. Healthwise, Incorporated disclaims any warranty or liability for your use of this information.

## 2022-12-16 NOTE — Progress Notes (Signed)
Patient presents today for routine prenatal visit. Pt denies any vaginal leaking bleeding or contractions. + Fetal movement.

## 2022-12-16 NOTE — Progress Notes (Signed)
CNM Prenatal Office Note  Subjective:  Krystal Bird is here for a return obstetrical visit. Today she is [redacted]w[redacted]d weeks EGA.   She is taking her prenatal vitamins and is aware of nutrition needs. She reports the following:    Problems/complaints today:  None  Objective:  Mother's Prenatal Vitals  BP: 126/68  Weight - Scale: 95.3 kg (210 lb)  Pulse: 78  Patient Position: Sitting  Prenatal Fetal Information  Movement: Present  Pt is A&Ox3, in no acute distress. Normocephalic, atraumatic. PERRL. Resp even and non-labored. Skin pink, warm & dry. Gravid abdomen. MAE's well. Gait steady.   Assessment:    IUP at [redacted]w[redacted]d wks      Diagnosis Orders   1. [redacted] weeks gestation of pregnancy        2. Encounter for prenatal care of first pregnancy, second trimester        3. Anxiety during pregnancy          Plan:  Problems/complaints Management Plan:  None  Routine OB Management Plan:  Pt counseled on balanced nutrition, adequate fluid intake, taking PNV daily, and exercise along with  anatomy reviewed  Continue with routine prenatal care.  RTC in 4 wks for prenatal visit.      MEDICATIONS:  No orders of the defined types were placed in this encounter.    ORDERS:  No orders of the defined types were placed in this encounter.      More than 50% of this 20 min visit was education and counseling.        I Edison Simon, LPN, am scribing for and in the presence of Antony Salmon, [place]  12/16/22  11:13 AM CDT .  I have seen and examined the patient independently.  I reviewed all laboratory and imaging studies that are relevant.  I have reviewed and made any appropriate changes to the HPI.    Electronically signed by Antony Salmon, APRN - CNM on 12/16/22  at 1:07 PM CDT

## 2023-01-13 ENCOUNTER — Encounter: Payer: BLUE CROSS/BLUE SHIELD | Attending: Women's Health | Primary: Pediatrics

## 2023-01-13 ENCOUNTER — Encounter
Admit: 2023-01-13 | Discharge: 2023-01-13 | Payer: BLUE CROSS/BLUE SHIELD | Attending: Women's Health | Primary: Pediatrics

## 2023-01-13 DIAGNOSIS — Z3A24 24 weeks gestation of pregnancy: Secondary | ICD-10-CM

## 2023-01-13 MED ORDER — SERTRALINE HCL 50 MG PO TABS
50 MG | ORAL_TABLET | Freq: Two times a day (BID) | ORAL | 5 refills | Status: AC
Start: 2023-01-13 — End: 2023-09-27

## 2023-01-13 NOTE — Progress Notes (Signed)
CNM Prenatal Office Note  Subjective:  Krystal Bird is here for a return obstetrical visit. Today she is [redacted]w[redacted]d weeks EGA.   She is taking her prenatal vitamins and is aware of nutrition needs. She reports the following:    Problems/complaints today:  None  Objective:  Mother's Prenatal Vitals  BP: 120/70  Weight - Scale: 100.2 kg (221 lb)  Pulse: 51  Patient Position: Sitting  Prenatal Fetal Information  Fundal Height (cm): 24 cm  Fetal HR: 155  Movement: Present  Pt is A&Ox3, in no acute distress. Normocephalic, atraumatic. PERRL. Resp even and non-labored. Skin pink, warm & dry. Gravid abdomen. MAE's well. Gait steady.   Assessment:    IUP at [redacted]w[redacted]d wks      Diagnosis Orders   1. [redacted] weeks gestation of pregnancy        2. Encounter for prenatal care of first pregnancy, second trimester        3. Anxiety during pregnancy  sertraline (ZOLOFT) 50 MG tablet        Plan:  Problems/complaints Management Plan:  None  Routine OB Management Plan:  Pt counseled on balanced nutrition, adequate fluid intake, taking PNV daily, and exercise along with  GCT next visit  Continue with routine prenatal care.  RTC in 4 wks for prenatal visit.      MEDICATIONS:  Orders Placed This Encounter   Medications    sertraline (ZOLOFT) 50 MG tablet     Sig: Take 1 tablet by mouth in the morning and at bedtime     Dispense:  60 tablet     Refill:  5     ORDERS:  No orders of the defined types were placed in this encounter.      More than 50% of this 20 min visit was education and counseling.        I Edison Simon, LPN, am scribing for and in the presence of Antony Salmon, [place]  01/13/23  12:06 PM CDT .  I have seen and examined the patient independently.  I reviewed all laboratory and imaging studies that are relevant.  I have reviewed and made any appropriate changes to the HPI.    Electronically signed by Antony Salmon, APRN - CNM on 01/13/23  at 12:41 PM CDT

## 2023-01-13 NOTE — Progress Notes (Signed)
Pt presents today for routine prenatal visit. Pt denies vaginal bleeding, cramping, or leaking of fluid +fetal movement.

## 2023-01-13 NOTE — Patient Instructions (Signed)
Learning About When to Call Your Doctor During Pregnancy (After 20 Weeks)  Overview  It's common to have concerns about what might be a problem when you're pregnant. Most pregnancies don't have any serious problems. But it's still important to know when to call your doctor if you have certain symptoms or signs of labor.  These are general suggestions. Your doctor may give you some more information about when to call.  When to call your doctor (after 20 weeks)  Call 911  anytime you think you may need emergency care. For example, call if:  You have severe vaginal bleeding. This means you are soaking through a pad each hour for 2 or more hours.  You have sudden, severe pain in your belly.  You have chest pain, are short of breath, or cough up blood.  You passed out (lost consciousness).  You have a seizure.  You see or feel the umbilical cord.  You think you are about to deliver your baby and can't make it safely to the hospital or birthing center.  Call your doctor now or seek immediate medical care if:  You have vaginal bleeding.  You have belly pain.  You have a fever.  You are dizzy or lightheaded, or you feel like you may faint.  You have signs of a blood clot in your leg (called a deep vein thrombosis), such as:  Pain in the calf, back of the knee, thigh, or groin.  Swelling in your leg or groin.  A color change on the leg or groin. The skin may be reddish or purplish, depending on your usual skin color.  You have symptoms of preeclampsia, such as:  Sudden swelling of your face, hands, or feet.  New vision problems (such as dimness, blurring, or seeing spots).  A severe headache.  You have a sudden release of fluid from your vagina. (You think your water broke.)  You've been having regular contractions for an hour. This means that you've had at least 6 contractions within 1 hour, even after you change your position and drink fluids.  You notice that your baby has stopped moving or is moving less than  normal.  You have signs of heart failure, such as:  New or increased shortness of breath.  New or worse swelling in your legs, ankles, or feet.  Sudden weight gain, such as more than 2 to 3 pounds in a day or 5 pounds in a week.  Feeling so tired or weak that you cannot do your usual activities.  You have symptoms of a urinary tract infection. These may include:  Pain or burning when you urinate.  A frequent need to urinate without being able to pass much urine.  Pain in the flank, which is just below the rib cage and above the waist on either side of the back.  Blood in your urine.  Watch closely for changes in your health, and be sure to contact your doctor if:  You have vaginal discharge that smells bad.  You feel sad, anxious, or hopeless for more than a few days.  You have skin changes, such as a rash, itching, or a yellow color to your skin.  You have other concerns about your pregnancy.  If you have labor signs at 37 weeks or more  If you have signs of labor at 37 weeks or more, your doctor may tell you to call when your labor becomes more active. Symptoms of active labor include:  Contractions that are  regular.  Contractions that are less than 5 minutes apart.  Contractions that are hard to talk through.  Follow-up care is a key part of your treatment and safety. Be sure to make and go to all appointments, and call your doctor if you are having problems. It's also a good idea to know your test results and keep a list of the medicines you take.  Where can you learn more?  Go to [employer] and enter N531 to learn more about "Learning About When to Call Your Doctor During Pregnancy (After 20 Weeks)."  Current as of: February 22, 2022               Content Version: 14.0   2006-2024 [employer].   Care instructions adapted under license by [facility]. If you have questions about a medical condition or this instruction, always ask your healthcare professional. [employer]corpor[employer] disclaims any warranty or liability for your use of this information.         Round Ligament Pain: Care Instructions  Overview     Round ligament pain is a common pain during pregnancy. You may feel a sharp brief pain on one or both sides of your belly. It may go down into your groin. It's usually felt for the first time during the second trimester. This pain is a normal part of pregnancy.  Your uterus is supported by two ligaments that go from the top and sides of the uterus to the bones of the pelvis. These are the round ligaments. As your uterus grows, these ligaments stretch and tighten with your movements. This may be the cause of the pain. You may find that certain activities seem to cause pain. If you can, avoid those activities.  Your doctor can usually diagnose round ligament pain from your symptoms and an exam. If you have bleeding or other symptoms, your doctor may also do an imaging test, such as an ultrasound. Your doctor may suggest some things that can help the pain, such as rest and strengthening exercises.  Follow-up care is a key part of your treatment and safety. Be sure to make and go to all appointments, and call your doctor if you are having problems. It's also a good idea to know your test results and keep a list of the medicines you take.  How can you care for yourself at home?  If certain movements seem to trigger belly pain, see if you can avoid them or try moving more slowly so the ligaments don't stretch quickly.  Stay active. If your doctor says it's okay, try moderate exercise. You might try things like swimming, walking, or stretching. Ask your doctor about strengthening and stretching exercises that may help.  Try a heating pad or cold pack on the area. A warm bath or shower may also help.  Rest when you can.  Ask your doctor about taking acetaminophen for pain. Be safe with medicines. Read and follow all instructions on the label.  Try a belly support band. Some people  find that these can help.  When should you call for help?   Call your doctor now or seek immediate medical care if:    You think you might be in labor.     You have new or worse pain.   Watch closely for changes in your health, and be sure to contact your doctor if you have any problems.  Where can you learn more?  Go to [employer] and enter R110 to learn  more about "Round Ligament Pain: Care Instructions."  Current as of: February 22, 2022               Content Version: 14.0   2006-2024 [employer].   Care instructions adapted under license by [facility]. If you have questions about a medical condition or this instruction, always ask your healthcare professional. [employer] disclaims any warranty or liability for your use of this information.       During Pregnancy: Exercises  Introduction  Here are some examples of exercises to do during your pregnancy. Start each exercise slowly. Ease off the exercise if you start to have pain.  Talk to your doctor about when you can start these exercises and which ones will work best for you.  How to do the exercises  Neck rotation    Sit up straight in a firm chair, or stand up straight. If you're standing, keep your feet about hip-width apart.  Keeping your chin level, turn your head to the right and hold for 15 to 30 seconds.  Turn your head to the left and hold for 15 to 30 seconds.  Repeat 2 to 4 times.  Neck stretch to the front    Sit up straight in a firm chair, or stand up straight. Look straight ahead. If you're standing, keep your feet about hip-width apart.  Slowly bend your head forward without moving your shoulders.  Hold for 15 to 30 seconds, then return to your starting position.  Repeat 2 to 4 times.  Back press    Stand with your back 10 to 12 inches away from a wall.  Lean into the wall until your back is against it. Press your lower back against the wall by pulling in your stomach muscles.  Slowly slide down  until your knees are slightly bent, pressing your lower back against the wall.  Hold for at least 6 seconds, then slide back up the wall.  Repeat 8 to 12 times.  Over time, work up to holding this position for as much as 1 minute.  Trunk twist    Sit on the floor with your legs crossed. If that's not comfortable, you can sit on a folded blanket so your bottom is a few inches off the floor. Or you can sit on a chair with your knees hip-width apart and your feet flat on the floor.  Reach your left hand toward your right knee. You can place your right hand at your side for support.  Slowly twist your body (trunk) to your right.  Relax and return to your starting position.  Repeat 2 to 4 times.  Switch your hands and twist to your left.  Repeat 2 to 4 times.  Pelvic rocking on hands and knees    Start on your hands and knees. Place your wrists directly below your shoulders and your knees below your hips.  Breathe in slowly. Tuck your head downward and round your back up, making a curve with your back in the shape of the letter C. Hold this position for about 6 seconds.  Breathe out slowly and bring your head back up. Relax, keeping your back straight. (Don't allow it to curve toward the floor.) Hold for about 6 seconds.  Repeat 8 to 12 times, gently rocking your pelvis.  Pelvic tilt    Lie on your back with your knees bent and your feet flat on the floor.  Tighten your belly muscles by pulling your belly button  in toward your spine. Press your lower back to the floor. You should feel your hips and pelvis rock back.  Hold for 6 seconds while breathing smoothly, and then relax.  Repeat 8 to 12 times.  Do this exercise only during the first 4 months of pregnancy. After this point, lying on your back is not recommended, because it can cause blood flow problems for you and your baby.  Backward stretch    Start on your hands and knees with your knees 8 to 10 inches apart, hands directly below your shoulders, and arms and back  straight.  Keeping your arms straight, slowly lower your buttocks toward your heels and tuck your head toward your knees. Hold for 15 to 30 seconds.  Slowly return to the starting position.  Repeat 2 to 4 times.  Forward bend    Sit comfortably in a chair, with your arms relaxed.  Slowly bend forward, allowing your arms to hang down. Lean only as far as you can without feeling discomfort or pressure on your belly.  Hold for 15 to 30 seconds and then slowly sit up straight.  Repeat 2 to 4 times or to your comfort level.  [employer] on your hands and knees. Place your hands directly below your shoulders, and keep your arms straight.  Tighten your belly muscles by pulling your belly button in toward your spine. Keep breathing normally, and don't hold your breath.  Lift one knee and bring it toward your elbow.  Slowly extend that leg behind you without completely straightening it. Be careful not to let your hip drop down. Avoid arching your back.  Hold your leg behind you for about 6 seconds.  Return to your starting position.  Repeat 8 to 12 times for each leg.  Tailor sitting    Sit on the floor.  Bring your feet close to your body while crossing your ankles.  Keep your back straight. Relax your legs and let your knees drop toward the floor.  Hold this position for as long as you are comfortable.  Toe reach    Sit on the floor with your back straight, legs about 12 inches apart, and feet relaxed outward.  Stretch your hands forward toward your right foot, then sit up.  Stretch your hands straight forward, then sit up.  Stretch your hands forward toward your left foot, then sit up.  Hold each stretch for 15 to 30 seconds.  Repeat 2 to 4 times.  Follow-up care is a key part of your treatment and safety. Be sure to make and go to all appointments, and call your doctor if you are having problems. It's also a good idea to know your test results and keep a list of the medicines you take.  Current as of: February 22, 2022               Content Version: 14.0   2006-2024 [employer].   Care instructions adapted under license by [facility]. If you have questions about a medical condition or this instruction, always ask your healthcare professional. [employer] disclaims any warranty or liability for your use of this information.       Learning About Screening for Gestational Diabetes  What is gestational diabetes screening?     Screening for gestational diabetes is a way to look for high blood sugar during pregnancy. You drink some very sweet liquid. Then you have a blood test to see how your  body uses sugar (glucose).  How is gestational diabetes screening done?  Screening for gestational diabetes may be done in a couple of ways.  Two-part screening.  Part one (glucose challenge test): A blood sample is taken after you drink a liquid that contains sugar (glucose). You don't need to stop eating or drinking before this test. If the test shows that you don't have a lot of sugar in your blood, you don't have gestational diabetes.  Part two (oral glucose tolerance test, or OGTT): If the first test shows a lot of sugar in your blood, then you may have an OGTT. You can't eat or drink for at least 8 hours before this test. A blood sample is taken, then you drink a sweet liquid. You have more blood tests after 1 to 3 hours. If the OGTT shows that you have a lot of sugar in your blood, you may have gestational diabetes.  One-part screening.  Sometimes doctors use the OGTT on its own. If the test shows that you don't have a lot of sugar in your blood, you don't have gestational diabetes. If you do have a lot of sugar in your blood, you may have the condition.  What are the risks of screening?  Your blood glucose level may drop very low toward the end of the test. If this happens, you may feel weak, hungry, and restless. Tell your doctor if you have these symptoms. The test usually will be stopped.  You may vomit  after drinking the sweet liquid. If this happens, you may need to take the test at a later time.  Your doctor may do more glucose tests at other times during your pregnancy.  Follow-up care is a key part of your treatment and safety. Be sure to make and go to all appointments, and call your doctor if you are having problems. It's also a good idea to know your test results and keep a list of the medicines you take.  Where can you learn more?  Go to [employer] and enter A472 to learn more about "Learning About Screening for Gestational Diabetes."  Current as of: May 17, 2022               Content Version: 14.0   2006-2024 [employer].   Care instructions adapted under license by [facility]. If you have questions about a medical condition or this instruction, always ask your healthcare professional. [employer] disclaims any warranty or liability for your use of this information.

## 2023-01-14 ENCOUNTER — Encounter

## 2023-01-17 NOTE — Telephone Encounter (Signed)
Called pt and r/s appt for 02/08/23.

## 2023-01-17 NOTE — Telephone Encounter (Signed)
PT called to reschedule appt for ultra sound  and pre natal visit, [facility] unable to accommodate, please , call anytime, today, thanks or tomorrow,

## 2023-02-07 NOTE — Patient Instructions (Signed)
Learning About When to Call Your Doctor During Pregnancy (After 20 Weeks)  Overview  It's common to have concerns about what might be a problem when you're pregnant. Most pregnancies don't have any serious problems. But it's still important to know when to call your doctor if you have certain symptoms or signs of labor.  These are general suggestions. Your doctor may give you some more information about when to call.  When to call your doctor (after 20 weeks)  Call 911  anytime you think you may need emergency care. For example, call if:  You have severe vaginal bleeding. This means you are soaking through a pad each hour for 2 or more hours.  You have sudden, severe pain in your belly.  You have chest pain, are short of breath, or cough up blood.  You passed out (lost consciousness).  You have a seizure.  You see or feel the umbilical cord.  You think you are about to deliver your baby and can't make it safely to the hospital or birthing center.  Call your doctor now or seek immediate medical care if:  You have vaginal bleeding.  You have belly pain.  You have a fever.  You are dizzy or lightheaded, or you feel like you may faint.  You have signs of a blood clot in your leg (called a deep vein thrombosis), such as:  Pain in the calf, back of the knee, thigh, or groin.  Swelling in your leg or groin.  A color change on the leg or groin. The skin may be reddish or purplish, depending on your usual skin color.  You have symptoms of preeclampsia, such as:  Sudden swelling of your face, hands, or feet.  New vision problems (such as dimness, blurring, or seeing spots).  A severe headache.  You have a sudden release of fluid from your vagina. (You think your water broke.)  You've been having regular contractions for an hour. This means that you've had at least 6 contractions within 1 hour, even after you change your position and drink fluids.  You notice that your baby has stopped moving or is moving less than  normal.  You have signs of heart failure, such as:  New or increased shortness of breath.  New or worse swelling in your legs, ankles, or feet.  Sudden weight gain, such as more than 2 to 3 pounds in a day or 5 pounds in a week.  Feeling so tired or weak that you cannot do your usual activities.  You have symptoms of a urinary tract infection. These may include:  Pain or burning when you urinate.  A frequent need to urinate without being able to pass much urine.  Pain in the flank, which is just below the rib cage and above the waist on either side of the back.  Blood in your urine.  Watch closely for changes in your health, and be sure to contact your doctor if:  You have vaginal discharge that smells bad.  You feel sad, anxious, or hopeless for more than a few days.  You have skin changes, such as a rash, itching, or a yellow color to your skin.  You have other concerns about your pregnancy.  If you have labor signs at 37 weeks or more  If you have signs of labor at 37 weeks or more, your doctor may tell you to call when your labor becomes more active. Symptoms of active labor include:  Contractions that are   regular.  Contractions that are less than 5 minutes apart.  Contractions that are hard to talk through.  Follow-up care is a key part of your treatment and safety. Be sure to make and go to all appointments, and call your doctor if you are having problems. It's also a good idea to know your test results and keep a list of the medicines you take.  Where can you learn more?  Go to https://www.healthwise.net/patientEd and enter N531 to learn more about "Learning About When to Call Your Doctor During Pregnancy (After 20 Weeks)."  Current as of: February 22, 2022               Content Version: 14.0   2006-2024 [employer], [employer].   Care instructions adapted under license by [facility]. If you have questions about a medical condition or this instruction, always ask your healthcare professional. Healthwise,  Incorpor[employer] disclaims any warranty or liability for your use of this information.       Round Ligament Pain: Care Instructions  Overview     Round ligament pain is a common pain during pregnancy. You may feel a sharp brief pain on one or both sides of your belly. It may go down into your groin. It's usually felt for the first time during the second trimester. This pain is a normal part of pregnancy.  Your uterus is supported by two ligaments that go from the top and sides of the uterus to the bones of the pelvis. These are the round ligaments. As your uterus grows, these ligaments stretch and tighten with your movements. This may be the cause of the pain. You may find that certain activities seem to cause pain. If you can, avoid those activities.  Your doctor can usually diagnose round ligament pain from your symptoms and an exam. If you have bleeding or other symptoms, your doctor may also do an imaging test, such as an ultrasound. Your doctor may suggest some things that can help the pain, such as rest and strengthening exercises.  Follow-up care is a key part of your treatment and safety. Be sure to make and go to all appointments, and call your doctor if you are having problems. It's also a good idea to know your test results and keep a list of the medicines you take.  How can you care for yourself at home?  If certain movements seem to trigger belly pain, see if you can avoid them or try moving more slowly so the ligaments don't stretch quickly.  Stay active. If your doctor says it's okay, try moderate exercise. You might try things like swimming, walking, or stretching. Ask your doctor about strengthening and stretching exercises that may help.  Try a heating pad or cold pack on the area. A warm bath or shower may also help.  Rest when you can.  Ask your doctor about taking acetaminophen for pain. Be safe with medicines. Read and follow all instructions on the label.  Try a belly support band. Some people  find that these can help.  When should you call for help?   Call your doctor now or seek immediate medical care if:    You think you might be in labor.     You have new or worse pain.   Watch closely for changes in your health, and be sure to contact your doctor if you have any problems.  Where can you learn more?  Go to https://www.healthwise.net/patientEd and enter R110 to learn more about "  Round Ligament Pain: Care Instructions."  Current as of: February 22, 2022               Content Version: 14.0   2006-2024 [employer].   Care instructions adapted under license by [facility]. If you have questions about a medical condition or this instruction, always ask your healthcare professional. Health[employer], Inc[employer] disclaims any warranty or liability for your use of this information.       Braxton Hicks Contractions: Care Instructions  Your Care Instructions     Braxton Hicks contractions prepare your uterus for labor. Think of them as a "warm-up" exercise that your body does. You may begin to feel them between the 28th and 30th weeks of your pregnancy. But they start as early as the 20th week.  Braxton Hicks contractions usually occur more often during the ninth month. They may go away when you are active and return when you rest. These contractions are like mild contractions of true labor, but they occur less often. (You feel fewer than 8 in an hour.) They don't cause your cervix to open.  It may be hard for you to tell the difference between Braxton Hicks contractions and true labor, especially in your first pregnancy.  Follow-up care is a key part of your treatment and safety. Be sure to make and go to all appointments, and call your doctor if you are having problems. It's also a good idea to know your test results and keep a list of the medicines you take.  How can you care for yourself at home?  Try a warm bath to help relieve muscle tension and reduce pain.  Change positions every 30 minutes. Take  breaks if you must sit for a long time. Get up and walk around.  Drink plenty of water.  Taking short walks may help you feel better.  Your doctor needs to check any contractions that are getting stronger or closer together.  Where can you learn more?  Go to https://www.healthwise.net/patientEd and enter Z402 to learn more about "Braxton Hicks Contractions: Care Instructions."  Current as of: February 22, 2022               Content Version: 14.0   2006-2024 [employer].   Care instructions adapted under license by [facility]. If you have questions about a medical condition or this instruction, always ask your healthcare professional. Health[employer], Incorporated disclaims any warranty or liability for your use of this information.       Learning About Depression During Pregnancy  Who is at risk for depression during pregnancy?     If you had depression before you became pregnant, you're more likely to have it during your pregnancy. Or you may have it for the first time when you're pregnant. Depression during pregnancy may also be more likely if you have anxiety or more stress in your life or lack social support.  How do you know if you are depressed?  With all the changes in your life, you may not know if you are depressed. Pregnancy sometimes causes changes in how you feel that are similar to the symptoms of depression.  Symptoms of depression include:  Feeling sad or hopeless and losing interest in daily activities. These are the most common symptoms of depression.  Sleeping too much or not enough.  Feeling tired. You may feel as if you have no energy.  Eating too much or too little.  Having trouble focusing or making decisions.  Feeling worthless   or having suicidal thoughts.  Where to get help 24 hours a day, 7 days a week   If you or someone you know talks about suicide, self-harm, a mental health crisis, a substance use crisis, or any other kind of emotional distress, get help right away. You can:  Call  the Suicide and Crisis Lifeline at 988.  Call 1-800-273-TALK (1-800-273-8255).  Text HOME to 741741 to access the Crisis Text Line.  Consider saving these numbers in your phone.  Go to 988lifeline.org for more information or to chat online.  How is depression during pregnancy treated?  Managing depression is important for your own health and the health of your baby. Treatment options include:  Counseling. This can focus on how you feel about your pregnancy, your relationships, and changes in your life. It gives you emotional support. And it can help you solve problems and set goals. One type of counseling helps you take charge of how you think and feel. This is called cognitive behavioral therapy.  Antidepressant medicines. These medicines may improve or get rid of depression symptoms. Whether you need them depends a lot on how bad your symptoms are. Talk to your doctor about whether this medicine is right for you.  Other treatments. Some other therapies that can help include light therapy, exercise or yoga, and massage therapy.  Follow-up care is a key part of your treatment and safety. Be sure to make and go to all appointments, and call your doctor if you are having problems. It's also a good idea to know your test results and keep a list of the medicines you take.  Where can you learn more?  Go to https://www.healthwise.net/patientEd and enter F937 to learn more about "Learning About Depression During Pregnancy."  Current as of: February 22, 2022               Content Version: 14.0   2006-2024 [employer], Inc[employer].   Care instructions adapted under license by [facility]. If you have questions about a medical condition or this instruction, always ask your healthcare professional. Healthwise, Incorporated disclaims any warranty or liability for your use of this information.       Tdap (Tetanus, Diphtheria, Pertussis) Vaccine: What You Need to Know  Why get vaccinated?  Tdap vaccine can prevent tetanus,  diphtheria, and pertussis.  Diphtheria and pertussis spread from person to person. Tetanus enters the body through cuts or wounds.  TETANUS (T) causes painful stiffening of the muscles. Tetanus can lead to serious health problems, including being unable to open the mouth, having trouble swallowing and breathing, or death.  DIPHTHERIA (D) can lead to difficulty breathing, heart failure, paralysis, or death.  PERTUSSIS (aP), also known as "whooping cough," can cause uncontrollable, violent coughing that makes it hard to breathe, eat, or drink. Pertussis can be extremely serious especially in babies and young children, causing pneumonia, convulsions, brain damage, or death. In teens and adults, it can cause weight loss, loss of bladder control, passing out, and rib fractures from severe coughing.  Tdap vaccine  Tdap is only for children 7 years and older, adolescents, and adults.  Adolescents should receive a single dose of Tdap, preferably at age 11 or 12 years.  Pregnant people should get a dose of Tdap during every pregnancy, preferably during the early part of the third trimester, to help protect the newborn from pertussis. Infants are most at risk for severe, life-threatening complications from pertussis.  Adults who have never received Tdap should get a   dose of Tdap.  Also, adults should receive a booster dose of either Tdap or Td (a different vaccine that protects against tetanus and diphtheria but not pertussis) every 10 years, or after 5 years in the case of a severe or dirty wound or burn.  Tdap may be given at the same time as other vaccines.  Talk with your health care provider  Tell your vaccination provider if the person getting the vaccine:  Has had an allergic reaction after a previous dose of any vaccine that protects against tetanus, diphtheria, or pertussis, or has any severe, life-threatening allergies  Has had a coma, decreased level of consciousness, or prolonged seizures within 7 days after a  previous dose of any pertussis vaccine (DTP, DTaP, or Tdap)  Has seizures or another nervous system problem  Has ever had Guillain-Barr Syndrome (also called "GBS")  Has had severe pain or swelling after a previous dose of any vaccine that protects against tetanus or diphtheria  In some cases, your health care provider may decide to postpone Tdap vaccination until a future visit.  People with minor illnesses, such as a cold, may be vaccinated. People who are moderately or severely ill should usually wait until they recover before getting Tdap vaccine.  Your health care provider can give you more information.  Risks of a vaccine reaction  Pain, redness, or swelling where the shot was given, mild fever, headache, feeling tired, and nausea, vomiting, diarrhea, or stomachache sometimes happen after Tdap vaccination.  People sometimes faint after medical procedures, including vaccination. Tell your provider if you feel dizzy or have vision changes or ringing in the ears.  As with any medicine, there is a very remote chance of a vaccine causing a severe allergic reaction, other serious injury, or death.  What if there is a serious problem?  An allergic reaction could occur after the vaccinated person leaves the clinic. If you see signs of a severe allergic reaction (hives, swelling of the face and throat, difficulty breathing, a fast heartbeat, dizziness, or weakness), call 9-1-1 and get the person to the nearest hospital.  For other signs that concern you, call your health care provider.  Adverse reactions should be reported to the Vaccine Adverse Event Reporting System (VAERS). Your health care provider will usually file this report, or you can do it yourself. Visit the VAERS website at www.vaers.hhs.gov or call 1-800-822-7967. VAERS is only for reporting reactions, and VAERS staff members do not give medical advice.  The National Vaccine Injury Compensation Program  The National Vaccine Injury Compensation Program  (VICP) is a federal program that was created to compensate people who may have been injured by certain vaccines. Claims regarding alleged injury or death due to vaccination have a time limit for filing, which may be as short as two years. Visit the VICP website at www.hrsa.gov/vaccinecompensation or call 1-800-338-2382 to learn about the program and about filing a claim.  How can I learn more?  Ask your health care provider.  Call your local or state health department.  Visit the website of the Food and Drug Administration (FDA) for vaccine package inserts and additional information at www.fda.gov/vaccines-blood-biologics/vaccines.  Contact the Centers for Disease Control and Prevention (CDC):  Call 1-800-232-4636 (1-800-CDC-INFO) or  Visit CDC's website at www.cdc.gov/vaccines.  Vaccine Information Statement  Tdap (Tetanus, Diphtheria, Pertussis) Vaccine  03/21/2020  42 [place]  [id number]  Department of Health and Human Services  Centers for Disease Control and Prevention  Many vaccine information statements are available   in Spanish and other languages. See www.immunize.org/vis  Hojas de informacin sobre vacunas estn disponibles en espaol y en muchos otros idiomas. Visite www.immunize.org/vis  Care instructions adapted under license by [facility]. If you have questions about a medical condition or this instruction, always ask your healthcare professional. Healthwise, Incorporated disclaims any warranty or liability for your use of this information.

## 2023-02-08 ENCOUNTER — Encounter

## 2023-02-08 ENCOUNTER — Encounter
Admit: 2023-02-08 | Discharge: 2023-02-08 | Payer: BLUE CROSS/BLUE SHIELD | Attending: Women's Health | Primary: Pediatrics

## 2023-02-08 ENCOUNTER — Ambulatory Visit: Admit: 2023-02-08 | Discharge: 2023-02-08 | Payer: BLUE CROSS/BLUE SHIELD | Primary: Pediatrics

## 2023-02-08 DIAGNOSIS — Z3A28 28 weeks gestation of pregnancy: Secondary | ICD-10-CM

## 2023-02-08 LAB — CBC
Hematocrit: 37.2 % (ref 37.0–47.0)
Hemoglobin: 12.6 g/dL (ref 12.0–16.0)
MCH: 33.1 pg — ABNORMAL HIGH (ref 27.0–31.0)
MCHC: 33.9 g/dL (ref 33.0–37.0)
MCV: 97.6 fL (ref 81.0–99.0)
MPV: 10.6 fL (ref 9.4–12.3)
Platelets: 194 10*3/uL (ref 130–400)
RBC: 3.81 M/uL — ABNORMAL LOW (ref 4.20–5.40)
RDW: 12.1 % (ref 11.5–14.5)
WBC: 12.2 10*3/uL — ABNORMAL HIGH (ref 4.8–10.8)

## 2023-02-08 LAB — GLUCOSE 1 HOUR POSTPRANDIAL: Glucose, 1Hr PP: 127 mg/dL (ref 75–140)

## 2023-02-08 MED ORDER — PANTOPRAZOLE SODIUM 20 MG PO TBEC
20 | ORAL_TABLET | Freq: Every day | ORAL | 5 refills | Status: DC
Start: 2023-02-08 — End: 2023-03-16

## 2023-02-08 NOTE — Progress Notes (Signed)
Pt presents today for routine prenatal visit. Pt denies vaginal bleeding, cramping, or leaking of fluid +fetal movement.

## 2023-02-08 NOTE — Progress Notes (Unsigned)
3d complete today

## 2023-02-08 NOTE — Progress Notes (Signed)
CNM Prenatal Office Note  Subjective:  Krystal Bird is here for a return obstetrical visit. Today she is [redacted]w[redacted]d weeks EGA.  Pt does feel fetal movement regularly. Denies headaches, RUQ pain, or visual changes as well as contractions, vaginal bleeding or leaking of fluid. 1 hour GCT today. Rhogam not indicated.   Problems/Complaints today:  Heartburn  Objective:  Mother's Prenatal Vitals  BP: 113/68  Weight - Scale: 102.5 kg (226 lb)  Pulse: 58  Patient Position: Sitting  Prenatal Fetal Information  Fundal Height (cm): 29 cm  Fetal HR: [place]  Movement: Present  Pt is A&Ox3, in no acute distress. Normocephalic, atraumatic. PERRL. Resp even and non-labored. Skin pink, warm & dry. Gravid abdomen. MAE's well. Gait steady. EPDS Screening: 8/30  Assessment:    IUP at [redacted]w[redacted]d wks      Diagnosis Orders   1. 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 Encounter for prenatal care of first pregnancy, third trimester  PR EVAL SELF-ASSESS DEPRESSION      3. Encounter for screening for maternal depression  PR EVAL SELF-ASSESS DEPRESSION      4. Heartburn during pregnancy in third trimester  pantoprazole (PROTONIX) 20 MG tablet        Plan:  Problems/Complaints Management Plan:  Protonix sent in  Routine OB Management Plan:  Third trimester teaching completed including warning signs for pre-eclampsia (blurred vision, seeing spots/sparkles, sudden increased weight gain or profound edema, epigastric pain), FKC (decreased fetal movments), preterm labor (preterm contractions or watery discharge, vaginal bleeding, cramping), n/v, chills, and or fever. Instructed pt to come to office or go to LDR if these symptoms presented. Pt voiced understanding.   Pt counseled on balanced nutrition, adequate fluid intake, taking PNV daily, and exercise along with GHTN precautions, Kick count, and Preterm labor  Continue with routine prenatal care.  RTC in 2 wks for prenatal  visit      MEDICATIONS:  Orders Placed This Encounter   Medications    pantoprazole (PROTONIX) 20 MG tablet     Sig: Take 1 tablet by mouth every morning (before breakfast)     Dispense:  30 tablet     Refill:  5     ORDERS:  Orders Placed This Encounter   Procedures    PR EVAL SELF-ASSESS DEPRESSION       More than 50% of this 20 min visit was education and counseling.        I Edison Simon, LPN, am scribing for and in the presence of Antony Salmon, [place]  02/08/23  9:05 AM CDT   I have seen and examined the patient independently.  I reviewed all laboratory and imaging studies that are relevant.  I have reviewed and made any appropriate changes to the HPI.    Electronically signed by Antony Salmon, APRN - CNM on 02/08/23  at 9:33 AM CDT

## 2023-02-10 ENCOUNTER — Encounter: Payer: BLUE CROSS/BLUE SHIELD | Attending: Women's Health | Primary: Pediatrics

## 2023-02-10 ENCOUNTER — Encounter: Payer: BLUE CROSS/BLUE SHIELD | Primary: Pediatrics

## 2023-02-10 LAB — RPR: RPR: NONREACTIVE

## 2023-02-21 ENCOUNTER — Emergency Department: Admit: 2023-02-21 | Payer: BLUE CROSS/BLUE SHIELD

## 2023-02-21 ENCOUNTER — Inpatient Hospital Stay
Admit: 2023-02-21 | Discharge: 2023-02-21 | Disposition: A | Discharge status: Home / Self Care | Payer: BLUE CROSS/BLUE SHIELD

## 2023-02-21 DIAGNOSIS — O99891 Other specified diseases and conditions complicating pregnancy: Secondary | ICD-10-CM

## 2023-02-21 LAB — CBC WITH AUTO DIFFERENTIAL
Basophils %: 0.4 % (ref 0.0–1.0)
Basophils Absolute: 0.1 10*3/uL (ref 0.00–0.20)
Eosinophils %: 1.2 % (ref 0.0–5.0)
Eosinophils Absolute: 0.2 10*3/uL (ref 0.00–0.60)
Hematocrit: 36.5 % — ABNORMAL LOW (ref 37.0–47.0)
Hemoglobin: 12.7 g/dL (ref 12.0–16.0)
Immature Granulocytes #: 0.2 10*3/uL
Lymphocytes %: 13.1 % — ABNORMAL LOW (ref 20.0–40.0)
Lymphocytes Absolute: 1.7 10*3/uL (ref 1.1–4.5)
MCH: 33.3 pg — ABNORMAL HIGH (ref 27.0–31.0)
MCHC: 34.8 g/dL (ref 33.0–37.0)
MCV: 95.8 fL (ref 81.0–99.0)
MPV: 10.5 fL (ref 9.4–12.3)
Monocytes %: 6.1 % (ref 0.0–10.0)
Monocytes Absolute: 0.8 10*3/uL (ref 0.00–0.90)
Neutrophils %: 77.7 % — ABNORMAL HIGH (ref 50.0–65.0)
Neutrophils Absolute: 10.1 10*3/uL — ABNORMAL HIGH (ref 1.5–7.5)
Platelets: 172 10*3/uL (ref 130–400)
RBC: 3.81 M/uL — ABNORMAL LOW (ref 4.20–5.40)
RDW: 11.9 % (ref 11.5–14.5)
WBC: 13 10*3/uL — ABNORMAL HIGH (ref 4.8–10.8)

## 2023-02-21 LAB — COMPREHENSIVE METABOLIC PANEL
ALT: 14 U/L (ref 5–33)
AST: 15 U/L (ref 5–32)
Albumin: 3.5 g/dL (ref 3.5–5.2)
Alkaline Phosphatase: 86 U/L (ref 35–104)
Anion Gap: 13 mmol/L (ref 7–19)
BUN: 7 mg/dL (ref 6–20)
CO2: 19 mmol/L — ABNORMAL LOW (ref 22–29)
Calcium: 8.4 mg/dL — ABNORMAL LOW (ref 8.6–10.0)
Chloride: 105 mmol/L (ref 98–111)
Creatinine: 0.4 mg/dL — ABNORMAL LOW (ref 0.5–0.9)
Est, Glom Filt Rate: >90 (ref >60)
Glucose: 81 mg/dL (ref 74–109)
Potassium: 4.1 mmol/L (ref 3.5–5.0)
Sodium: 137 mmol/L (ref 136–145)
Total Bilirubin: 0.2 mg/dL (ref 0.2–1.2)
Total Protein: 6.3 g/dL — ABNORMAL LOW (ref 6.6–8.7)

## 2023-02-21 MED ORDER — SODIUM CHLORIDE 0.9 % IV BOLUS
0.9 | Freq: Once | INTRAVENOUS | Status: AC
Start: 2023-02-21 — End: 2023-02-21
  Administered 2023-02-21: 14:00:00 500 mL via INTRAVENOUS

## 2023-02-21 MED ORDER — ONDANSETRON HCL 4 MG/2ML IJ SOLN
4 | Freq: Once | INTRAMUSCULAR | Status: AC
Start: 2023-02-21 — End: 2023-02-21
  Administered 2023-02-21: 14:00:00 4 mg via INTRAVENOUS

## 2023-02-21 MED ORDER — ACETAMINOPHEN 500 MG PO TABS
500 | Freq: Once | ORAL | Status: AC
Start: 2023-02-21 — End: 2023-02-21
  Administered 2023-02-21: 15:00:00 1000 mg via ORAL

## 2023-02-21 MED FILL — TYLENOL EXTRA STRENGTH 500 MG PO TABS: 500 MG | ORAL | Qty: 2

## 2023-02-21 MED FILL — ONDANSETRON HCL 4 MG/2ML IJ SOLN: 4 MG/2ML | INTRAMUSCULAR | Qty: 2

## 2023-02-21 NOTE — ED Notes (Signed)
U/s at bedside

## 2023-02-21 NOTE — Discharge Instructions (Addendum)
Rest today and tomorrow.  Follow up with OB as scheduled.  Return to ER for any new, worsening, or change in condition.   Be sure you are drinking plenty of water.

## 2023-02-21 NOTE — ED Provider Notes (Addendum)
[facility] EMERGENCY DEPT  EMERGENCY DEPARTMENT ENCOUNTER      Pt Name: Krystal Bird  MRN: [id number]  Birthdate May 05, 1996  Date of evaluation: 02/21/2023  Provider: Lowella Dell, APRN - NP    CHIEF COMPLAINT       Chief Complaint   Patient presents with    Loss of Consciousness     Patient is [redacted] weeks pregnant, had some nausea/vomiting this morning; then had syncopal episode while at work, did not fall, could feel it happening         HISTORY OF PRESENT ILLNESS   (Location/Symptom, Timing/Onset,Context/Setting, Quality, Duration, Modifying Factors, Severity)  Note limiting factors.   Krystal Bird is a 27 y.o. female who presents to the emergency department with report of "passing out" episode at work this morning.  She was attending a meeting and felt nauseous and lightheaded and as she was trying to leave the meeting she felt worse and slid down a wall into a seated position on the floor.  She denies striking her head or any trauma from the incident.  She is [redacted] weeks pregnant and reports similar episodes, "although not this bad" in the past.  This is her first pregnancy.  She denies any urinary symptoms or upper respiratory symptoms or any other recent illness.              NursingNotes were reviewed.    REVIEW OF SYSTEMS    (2-9 systems for level 4, 10 or more for level 5)     Review of Systems   Constitutional:  Positive for fatigue.        Gravid abdomen   HENT: Negative.     Respiratory: Negative.     Cardiovascular: Negative.    Gastrointestinal:  Positive for nausea and vomiting. Negative for abdominal pain.   Genitourinary:  Negative for difficulty urinating, dysuria, hematuria, vaginal bleeding and vaginal discharge.   Musculoskeletal: Negative.    Neurological:  Positive for syncope and light-headedness.       A complete review of systems was performed and is negative except as noted above in the HPI.       PAST MEDICAL HISTORY   History reviewed. No pertinent past medical history.      SURGICAL HISTORY      History reviewed. No pertinent surgical history.      CURRENT MEDICATIONS       Discharge Medication List as of 02/21/2023 11:51 AM        CONTINUE these medications which have NOT CHANGED    Details   pantoprazole (PROTONIX) 20 MG tablet Take 1 tablet by mouth every morning (before breakfast), Disp-30 tablet, R-5Normal      sertraline (ZOLOFT) 50 MG tablet Take 1 tablet by mouth in the morning and at bedtime, Disp-60 tablet, R-5Normal      ferrous sulfate (FE TABS 325) 325 (65 Fe) MG EC tablet Take 1 tablet by mouth every other dayHistorical Med      Prenat-B2-B6-B12-D3-FA (PRENA1 PO) Take by mouthHistorical Med             ALLERGIES     Patient has no known allergies.    FAMILY HISTORY     History reviewed. No pertinent family history.       SOCIAL HISTORY       Social History     Socioeconomic History    Marital status: Life Partner     Spouse name: None    Number of children: None  Years of education: None    Highest education level: None   Tobacco Use    Smoking status: Never   Substance and Sexual Activity    Alcohol use: No    Drug use: No       SCREENINGS    Glasgow Coma Scale  Eye Opening: Spontaneous  Best Verbal Response: Oriented  Best Motor Response: Obeys commands  Glasgow Coma Scale Score: 15        PHYSICAL EXAM    (up to 7 for level 4, 8 or more for level 5)     ED Triage Vitals [02/21/23 0919]   BP Temp Temp src Pulse Respirations SpO2 Height Weight   108/66 98.2 F (36.8 C) -- 67 18 98 % -- --       Physical Exam  Vitals and nursing note reviewed.   Constitutional:       Appearance: Normal appearance. She is normal weight.      Comments: Gravid abdomen; OB nurse at bedside attaching toco monitor, fetal heart rate 134.   HENT:      Head: Normocephalic and atraumatic.      Nose: Nose normal.      Mouth/Throat:      Mouth: Mucous membranes are dry.   Eyes:      Pupils: Pupils are equal, round, and reactive to light.   Cardiovascular:      Rate and Rhythm: Normal rate and regular rhythm.    Pulmonary:      Effort: Pulmonary effort is normal.      Breath sounds: Normal breath sounds.   Abdominal:      General: Bowel sounds are normal.      Tenderness: There is no abdominal tenderness.   Musculoskeletal:         General: Normal range of motion.      Cervical back: Normal range of motion and neck supple.   Skin:     General: Skin is warm and dry.      Capillary Refill: Capillary refill takes less than 2 seconds.   Neurological:      General: No focal deficit present.      Mental Status: She is alert and oriented to person, place, and time.         DIAGNOSTIC RESULTS     EKG: All EKG's are interpreted by the Emergency Department Physician who either signs or Co-signs this chart in the absence of a cardiologist.    Ectopic atrial rhythm with rate of 62, PRI 108, QRS 89, QTc 423, axis within normal limits; no prior EKG for comparison.    RADIOLOGY:   Non-plain film images such as CT, Ultrasound and MRI are read by the radiologist. Plainradiographic images are visualized and preliminarily interpreted by the emergency physician with the below findings:        Interpretation per the Radiologist below, if available at the time of this note:    US FETAL BIOPHYSICAL PROFILE WO NON STRESS TESTING   Final Result   Biophysical profile score 8/8.           ______________________________________    Electronically signed by: Al Pimple M.D.   Date:     02/21/2023   Time:    11:47             ED BEDSIDE ULTRASOUND:   Performed by ED Physician - none    LABS:  Labs Reviewed   CBC WITH AUTO DIFFERENTIAL - Abnormal; Notable for the following components:  Result Value    WBC 13.0 (*)     RBC 3.81 (*)     Hematocrit 36.5 (*)     MCH 33.3 (*)     Neutrophils % 77.7 (*)     Lymphocytes % 13.1 (*)     Neutrophils Absolute 10.1 (*)     All other components within normal limits   COMPREHENSIVE METABOLIC PANEL - Abnormal; Notable for the following components:    CO2 19 (*)     Creatinine 0.4 (*)     Calcium 8.4 (*)      Total Protein 6.3 (*)     All other components within normal limits   URINALYSIS WITH REFLEX TO CULTURE       All other labs were within normal range or not returned as of this dictation.    Medications   sodium chloride 0.9 % bolus 500 mL (0 mLs IntraVENous Stopped 02/21/23 1135)   ondansetron (ZOFRAN) injection 4 mg (4 mg IntraVENous Given 02/21/23 0934)   acetaminophen (TYLENOL) tablet 1,000 mg (1,000 mg Oral Given 02/21/23 1059)       EMERGENCY DEPARTMENT COURSE and DIFFERENTIALDIAGNOSIS/MDM:   Vitals:    Vitals:    02/21/23 0919 02/21/23 0949 02/21/23 1004 02/21/23 1130   BP: 108/66 106/67 101/62 103/63   Pulse: 67 66 64 66   Resp: 18 18 18 16    Temp: 98.2 F (36.8 C)      SpO2: 98% 96% 96% 96%       MDM  Number of Diagnoses or Management Options  Syncope and collapse  Diagnosis management comments: 27 year old well-appearing female who is [redacted] weeks pregnant presented to the emergency department after syncopal episode at her place of employment.  She states that she leaned against the wall and slid to the floor.  She has had a similar episode in the past.  CBC and CMP are unremarkable and EKG with an atrial rhythm with rate of 67, vital signs otherwise stable for 30-week gestation.  Patient complained of headache and Tylenol administered with improvement in symptoms.  NST performed by [facility] nurses and discussed patient findings with patient's OB practitioner who is agreeable to discharge from her perspective.  Patient is accompanied by her spouse and will be discharged home.        ED Course as of 02/21/23 1232   Mon Feb 21, 2023   1101 Spoke with patient's OB provider, Tyrone Sage, who requests NST.  Will order and send patient to [facility] for the test. [TG]      ED Course User Index  [TG] Lowella Dell, APRN - NP       CONSULTS:  None    PROCEDURES:  Unless otherwise notedbelow, none     Procedures      FINAL IMPRESSION     1. Syncope and collapse          DISPOSITION/PLAN   DISPOSITION Decision To Discharge  02/21/2023 11:46:15 AM      No notes of EC Admission Criteria type on file.    PATIENT REFERRED TO:  No follow-up provider specified.    DISCHARGE MEDICATIONS:  Discharge Medication List as of 02/21/2023 11:51 AM             (Please note that portions of this note were completed with a voice recognition program.  Efforts were made to edit the dictations butoccasionally words are mis-transcribed.)    Lowella Dell, APRN - NP (electronically signed)  AttendingEmergency Physician  Lowella Dell, APRN - NP  02/21/23 1153       Lowella Dell, APRN - NP  02/21/23 [phone]

## 2023-02-21 NOTE — Progress Notes (Signed)
Provider ordered NST, read and patient is reactive. Patient is removed from monitor and discharged from [facility].

## 2023-02-21 NOTE — Other (Signed)
OB RN to bedside with portable fetal monitor, pt denies leaking of fluid, denies vaginal bleeding, denies contractions, + fetal movement, TOCO and EFM placed to soft non-tender abd.

## 2023-02-21 NOTE — ED Notes (Addendum)
Paged Dr. Braulio Bosch, OB/GYN for Marcy Salvo and texted Dr. Laural Benes

## 2023-02-22 LAB — EKG 12-LEAD
P Axis: -77 degrees
P-R Interval: 108 ms
Q-T Interval: 422 ms
QRS Duration: 78 ms
QTc Calculation (Bazett): 427 ms
T Axis: 10 degrees

## 2023-02-23 ENCOUNTER — Encounter: Admit: 2023-02-23 | Discharge: 2023-02-23 | Payer: BLUE CROSS/BLUE SHIELD | Attending: Women's Health

## 2023-02-23 DIAGNOSIS — Z3A30 30 weeks gestation of pregnancy: Secondary | ICD-10-CM

## 2023-02-23 NOTE — Progress Notes (Signed)
CNM Prenatal Office Note  Subjective:  Krystal Bird is here for a return obstetrical visit. Today she is [redacted]w[redacted]d weeks EGA.  Pt does feel fetal movement regularly. Denies headaches, RUQ pain, or visual changes as well as contractions, vaginal bleeding or leaking of fluid.     Problems/complaints today:  Headaches  Stomach cramping and passing out.   Objective:  Mother's Prenatal Vitals  BP: 130/75  Weight - Scale: 104.3 kg (230 lb)  Pulse: 65  Patient Position: Sitting  Prenatal Fetal Information  Fundal Height (cm): 30 cm  Fetal HR: 152  Movement: Present  Pt is A&Ox3, in no acute distress. Normocephalic, atraumatic. PERRL. Resp even and non-labored. Skin pink, warm & dry. Gravid abdomen. MAE's well. Gait steady.   Assessment:    IUP at [redacted]w[redacted]d wks      Diagnosis Orders   1. [redacted] weeks gestation of pregnancy        2. Encounter for prenatal care of first pregnancy, third trimester          Plan:  Problems/Complaints Management Plan:  Try magnesium cocktail. Can prescribe medication if no help.  Discussed adequate fluid intake in depth.   Routine OB Management Plan:  Pt counseled on balanced nutrition, adequate fluid intake, taking PNV daily, and exercise along with GHTN precautions, Kick count, and Preterm labor  Continue with routine prenatal care.  Antenatal surveillance not indicated  RTC in 2 wks for prenatal visit    MEDICATIONS:  No orders of the defined types were placed in this encounter.    ORDERS:  No orders of the defined types were placed in this encounter.      More than 50% of this 20 min visit was education and counseling.        I Edison Simon, LPN, am scribing for and in the presence of Antony Salmon, [place]  02/23/23  10:51 AM CDT   I have seen and examined the patient independently.  I reviewed all laboratory and imaging studies that are relevant.  I have reviewed and made any appropriate changes to the HPI.    Electronically signed by Antony Salmon, APRN - CNM on 02/23/23  at 11:31 AM CDT

## 2023-02-23 NOTE — Progress Notes (Signed)
Pt presents today for routine prenatal visit. Pt denies vaginal bleeding, cramping, or leaking of fluid +fetal movement.     Pt states that she passed out Monday. Pt also stated she is having bad cramps in lower belly that wrap around to her back. Pt also c/o that she has had a migraine or headache everyday for the past week.

## 2023-02-23 NOTE — Patient Instructions (Signed)
Learning About When to Call Your Doctor During Pregnancy (After 20 Weeks)  Overview  It's common to have concerns about what might be a problem when you're pregnant. Most pregnancies don't have any serious problems. But it's still important to know when to call your doctor if you have certain symptoms or signs of labor.  These are general suggestions. Your doctor may give you some more information about when to call.  When to call your doctor (after 20 weeks)  Call 911  anytime you think you may need emergency care. For example, call if:  You have severe vaginal bleeding. This means you are soaking through a pad each hour for 2 or more hours.  You have sudden, severe pain in your belly.  You have chest pain, are short of breath, or cough up blood.  You passed out (lost consciousness).  You have a seizure.  You see or feel the umbilical cord.  You think you are about to deliver your baby and can't make it safely to the hospital or birthing center.  Call your doctor now or seek immediate medical care if:  You have vaginal bleeding.  You have belly pain.  You have a fever.  You are dizzy or lightheaded, or you feel like you may faint.  You have signs of a blood clot in your leg (called a deep vein thrombosis), such as:  Pain in the calf, back of the knee, thigh, or groin.  Swelling in your leg or groin.  A color change on the leg or groin. The skin may be reddish or purplish, depending on your usual skin color.  You have symptoms of preeclampsia, such as:  Sudden swelling of your face, hands, or feet.  New vision problems (such as dimness, blurring, or seeing spots).  A severe headache.  You have a sudden release of fluid from your vagina. (You think your water broke.)  You've been having regular contractions for an hour. This means that you've had at least 6 contractions within 1 hour, even after you change your position and drink fluids.  You notice that your baby has stopped moving or is moving less than  normal.  You have signs of heart failure, such as:  New or increased shortness of breath.  New or worse swelling in your legs, ankles, or feet.  Sudden weight gain, such as more than 2 to 3 pounds in a day or 5 pounds in a week.  Feeling so tired or weak that you cannot do your usual activities.  You have symptoms of a urinary tract infection. These may include:  Pain or burning when you urinate.  A frequent need to urinate without being able to pass much urine.  Pain in the flank, which is just below the rib cage and above the waist on either side of the back.  Blood in your urine.  Watch closely for changes in your health, and be sure to contact your doctor if:  You have vaginal discharge that smells bad.  You feel sad, anxious, or hopeless for more than a few days.  You have skin changes, such as a rash, itching, or a yellow color to your skin.  You have other concerns about your pregnancy.  If you have labor signs at 37 weeks or more  If you have signs of labor at 37 weeks or more, your doctor may tell you to call when your labor becomes more active. Symptoms of active labor include:  Contractions that are   regular.  Contractions that are less than 5 minutes apart.  Contractions that are hard to talk through.  Follow-up care is a key part of your treatment and safety. Be sure to make and go to all appointments, and call your doctor if you are having problems. It's also a good idea to know your test results and keep a list of the medicines you take.  Where can you learn more?  Go to https://www.healthwise.net/patientEd and enter N531 to learn more about "Learning About When to Call Your Doctor During Pregnancy (After 20 Weeks)."  Current as of: February 22, 2022               Content Version: 14.0   2006-2024 Healthwise, Incorporated.   Care instructions adapted under license by [facility]. If you have questions about a medical condition or this instruction, always ask your healthcare professional. Healthwise,  Incorporated disclaims any warranty or liability for your use of this information.       Braxton Hicks Contractions: Care Instructions  Your Care Instructions     Braxton Hicks contractions prepare your uterus for labor. Think of them as a "warm-up" exercise that your body does. You may begin to feel them between the 28th and 30th weeks of your pregnancy. But they start as early as the 20th week.  Braxton Hicks contractions usually occur more often during the ninth month. They may go away when you are active and return when you rest. These contractions are like mild contractions of true labor, but they occur less often. (You feel fewer than 8 in an hour.) They don't cause your cervix to open.  It may be hard for you to tell the difference between Braxton Hicks contractions and true labor, especially in your first pregnancy.  Follow-up care is a key part of your treatment and safety. Be sure to make and go to all appointments, and call your doctor if you are having problems. It's also a good idea to know your test results and keep a list of the medicines you take.  How can you care for yourself at home?  Try a warm bath to help relieve muscle tension and reduce pain.  Change positions every 30 minutes. Take breaks if you must sit for a long time. Get up and walk around.  Drink plenty of water.  Taking short walks may help you feel better.  Your doctor needs to check any contractions that are getting stronger or closer together.  Where can you learn more?  Go to https://www.healthwise.net/patientEd and enter Z402 to learn more about "Braxton Hicks Contractions: Care Instructions."  Current as of: February 22, 2022               Content Version: 14.0   2006-2024 [employer]wise, Inc[employer].   Care instructions adapted under license by [facility]. If you have questions about a medical condition or this instruction, always ask your healthcare professional. Healthwise, Incorporated disclaims any warranty or liability for your  use of this information.       Getting Ready for Baby: Care Instructions  Your Care Instructions     Congratulations! You are heading into an exciting adventure. You can make your entry into parenthood a little less hectic by planning now and gathering some of the items you will need. You will be too busy (and probably too tired) to do much shopping after the baby arrives.  These tips will help you get started. Some items you may need to buy, but   do not be shy about taking donations of clothes and other items from family and friends. Getting an early start can allow you to stock up over time, which might be easier on your wallet.  Follow-up care is a key part of your treatment and safety. Be sure to make and go to all appointments, and call your doctor if you are having problems.  What do you need to have at home?  Freeze meals ahead  Try to cook and freeze meals in the weeks before the baby comes. Family and friends may be able to help cook meals. You may not have the time or the energy to cook in the first weeks after the baby arrives.  Baby furniture and car seat  Make sure all the baby gear you buy meets safety standards set by the U.[place] Consumer Product Safety Commission. Although most new items will likely meet these standards, older and used items may not. Equipment that has been used before may not be safe.  Cribs should have less than 2.4 in. (6.1 cm) of space between the slats. Lower the mattress as your baby grows. Your baby may try to climb out of the crib if the mattress is not lowered.  Baby walkers should not be used, according to recommendations from the American Academy of Pediatrics.  Playpens should have spaces in the mesh material that are no greater than 0.25 in. (0.64 cm) across. Wood slats should be less than 2.4 in. (6.1 cm) apart. Look for a playpen or travel crib that has top rails that lock into position. This may prevent the rail from collapsing.  Stroller wheels should be in a locked  position before you put your baby in the stroller. Use the straps to secure your baby so that your baby cannot lean out as he or she gets more mobile. Fasten any toys or bumpers so that they do not fall on your baby. Remove these items as soon as your infant can sit or get up on all fours. Make sure releases and hinges are out of your baby's reach, especially if the stroller can fold.  High chairs should have a wide, stable base. Do not use booster seats that attach to the table. Always make sure the high chair is locked in the upright position before use. Use the safety straps, and stay with your baby at all times while he or she is in the high chair.  Changing tables should have a railing on all sides that is 2 in. (5 cm) high. Try to use a slightly indented changing pad. Always use the safety strap, and keep one hand on your baby. Have diapers and other items handy, but keep them out of your baby's reach. (If you do not have a changing table, you can change your baby on a towel on the floor.)  Get a new car seat and put your baby in it every time you drive with him or her. Getting a new seat is the best way to make sure that a seat meets safety standards and has not already been used in an accident.  Make sure the car seat is properly installed. See the maker's instructions. If you are not sure how to put in the car seat, have your car seat checked at a police station.  Make sure the car seat is the right fit for your child's current age, weight, or height. For safety, it is very important to have a car seat that fits your   child. And it is safest for the seat to face the rear until your child outgrows the height and weight requirement by the car seat manufacturer.  Put the car seat in a rear seat, not in the front passenger seat. This will keep your child from getting hurt by the air bag in an accident. If you have rear side air bags, put your child's car seat in the middle seat.  For more information about car  seats, call the National Highway Traffic Safety Administration at 1-888-327-4236.  Baby care items  Start stocking up on disposable diapers (unless you plan to use cloth diapers) and wipes. If you want, you can buy a few packages of newborn diapers, which come with a cutout in the front so the diaper does not touch the baby's umbilical cord stump. You also can buy regular infant diapers and roll down the front.  You may get some baby clothes from family and friends at baby showers and after the baby arrives. Ask for (or buy) a few basics to get you started. Get several sleep sacks or nightgowns, T-shirts that snap at the crotch (called "onesies"), small blankets to wrap the baby in (called receiving blankets), and one-piece outfits that snap or zip down one leg. This is so that you do not have to take off all the baby's clothes to change a diaper. Socks or booties are also a good idea to keep your baby's feet warm. Get a cotton cap or two. Newborns also need their heads covered to stay warm.  Put together a kit of baby care items. Include diaper rash cream, baby nail clippers, a nasal bulb syringe (to help clear a stuffy nose), and baby wash, which also can be used as shampoo.  If you plan to breastfeed, buy a couple of nursing bras. You can wear one while the other one is in the wash. Also, you may want get a nipple cream that contains lanolin. Some women also like to put nursing pads in their bras to prevent breast milk from leaking onto their clothes.  If you plan to bottle-feed, buy several cans of formula and several bottles to get you started.  Where can you learn more?  Go to https://www.healthwise.net/patientEd and enter T918 to learn more about "Getting Ready for Baby: Care Instructions."  Current as of: February 22, 2022               Content Version: 14.0   2006-2024 [employer], Inc[employer].   Care instructions adapted under license by [facility]. If you have questions about a medical condition or this  instruction, always ask your healthcare professional. Healthwise, Incorporated disclaims any warranty or liability for your use of this information.

## 2023-02-26 ENCOUNTER — Ambulatory Visit: Admit: 2023-02-26 | Payer: BLUE CROSS/BLUE SHIELD

## 2023-02-26 ENCOUNTER — Inpatient Hospital Stay
Admission: RE | Admit: 2023-02-26 | Discharge: 2023-02-28 | Disposition: A | Discharge status: Home / Self Care | Payer: BLUE CROSS/BLUE SHIELD | Admitting: Obstetrics & Gynecology

## 2023-02-26 DIAGNOSIS — R6889 Other general symptoms and signs: Secondary | ICD-10-CM

## 2023-02-26 DIAGNOSIS — O99353 Diseases of the nervous system complicating pregnancy, third trimester: Secondary | ICD-10-CM

## 2023-02-26 LAB — CBC WITH AUTO DIFFERENTIAL
Basophils %: 0.3 % (ref 0.0–1.0)
Basophils Absolute: 0 10*3/uL (ref 0.00–0.20)
Eosinophils %: 0.6 % (ref 0.0–5.0)
Eosinophils Absolute: 0.1 10*3/uL (ref 0.00–0.60)
Hematocrit: 35.7 % — ABNORMAL LOW (ref 37.0–47.0)
Hemoglobin: 12.2 g/dL (ref 12.0–16.0)
Immature Granulocytes #: 0.1 10*3/uL
Lymphocytes %: 12.1 % — ABNORMAL LOW (ref 20.0–40.0)
Lymphocytes Absolute: 1.4 10*3/uL (ref 1.1–4.5)
MCH: 33.1 pg — ABNORMAL HIGH (ref 27.0–31.0)
MCHC: 34.2 g/dL (ref 33.0–37.0)
MCV: 96.7 fL (ref 81.0–99.0)
MPV: 11.1 fL (ref 9.4–12.3)
Monocytes %: 5.1 % (ref 0.0–10.0)
Monocytes Absolute: 0.6 10*3/uL (ref 0.00–0.90)
Neutrophils %: 80.9 % — ABNORMAL HIGH (ref 50.0–65.0)
Neutrophils Absolute: 9.6 10*3/uL — ABNORMAL HIGH (ref 1.5–7.5)
Platelets: 158 10*3/uL (ref 130–400)
RBC: 3.69 M/uL — ABNORMAL LOW (ref 4.20–5.40)
RDW: 11.9 % (ref 11.5–14.5)
WBC: 11.9 10*3/uL — ABNORMAL HIGH (ref 4.8–10.8)

## 2023-02-26 LAB — URINALYSIS WITH REFLEX TO CULTURE
Bilirubin, Urine: NEGATIVE
Blood, Urine: NEGATIVE
Glucose, Ur: NEGATIVE mg/dL
Leukocyte Esterase, Urine: NEGATIVE
Nitrite, Urine: NEGATIVE
Protein, UA: NEGATIVE mg/dL
Specific Gravity, UA: 1.005 (ref 1.005–1.030)
Urobilinogen, Urine: 1 E.U./dL (ref <2.0)
pH, Urine: 7 (ref 5.0–8.0)

## 2023-02-26 LAB — DRUG SCRN, BUPRENORPHINE
Amphetamine Screen, Ur: NEGATIVE (ref <500)
Barbiturate Screen, Ur: NEGATIVE (ref <200)
Benzodiazepine Screen, Urine: NEGATIVE (ref <150)
Buprenorphine Urine: NEGATIVE (ref <10)
Cannabinoid Scrn, Ur: NEGATIVE (ref <50)
Cocaine Metabolite Screen, Urine: NEGATIVE (ref <150)
FENTANYL SCREEN, URINE: NEGATIVE (ref <5)
Methadone Screen, Urine: NEGATIVE (ref <200)
Methamphetamine, Urine: NEGATIVE (ref <500)
Opiate Screen, Urine: NEGATIVE (ref <100)
Oxycodone Urine: NEGATIVE (ref <100)
Phencyclidine (PCP), Screen, Urine: NEGATIVE (ref <25)
Tricyclic Antidepressants, Urine: NEGATIVE (ref <300)

## 2023-02-26 LAB — COMPREHENSIVE METABOLIC PANEL
ALT: 13 U/L (ref 5–33)
AST: 13 U/L (ref 5–32)
Albumin: 3.3 g/dL — ABNORMAL LOW (ref 3.5–5.2)
Alkaline Phosphatase: 82 U/L (ref 35–104)
Anion Gap: 15 mmol/L (ref 7–19)
BUN: 7 mg/dL (ref 6–20)
CO2: 18 mmol/L — ABNORMAL LOW (ref 22–29)
Calcium: 8.3 mg/dL — ABNORMAL LOW (ref 8.6–10.0)
Chloride: 101 mmol/L (ref 98–111)
Creatinine: 0.4 mg/dL — ABNORMAL LOW (ref 0.5–0.9)
Est, Glom Filt Rate: >90 (ref >60)
Glucose: 169 mg/dL — ABNORMAL HIGH (ref 74–109)
Potassium: 4 mmol/L (ref 3.5–5.0)
Sodium: 134 mmol/L — ABNORMAL LOW (ref 136–145)
Total Bilirubin: 0.2 mg/dL (ref 0.2–1.2)
Total Protein: 6.1 g/dL — ABNORMAL LOW (ref 6.6–8.7)

## 2023-02-26 LAB — INDIRECT BILIRUBIN: Bilirubin, Indirect: 0.1 mg/dL (ref 0.1–1.0)

## 2023-02-26 LAB — COMPREHENSIVE METABOLIC PANEL W/ REFLEX TO MG FOR LOW K
ALT: 10 U/L (ref 5–33)
AST: 11 U/L (ref 5–32)
Albumin: 3.7 g/dL (ref 3.5–5.2)
Alkaline Phosphatase: 87 U/L (ref 35–104)
Anion Gap: 15 mmol/L (ref 7–19)
BUN: 8 mg/dL (ref 6–20)
CO2: 20 mmol/L — ABNORMAL LOW (ref 22–29)
Calcium: 8.8 mg/dL (ref 8.6–10.0)
Chloride: 99 mmol/L (ref 98–111)
Creatinine: 0.5 mg/dL (ref 0.5–0.9)
Est, Glom Filt Rate: >90 (ref >60)
Glucose: 79 mg/dL (ref 74–109)
Potassium reflex Magnesium: 3.9 mmol/L (ref 3.5–5.0)
Sodium: 134 mmol/L — ABNORMAL LOW (ref 136–145)
Total Bilirubin: 0.2 mg/dL (ref 0.2–1.2)
Total Protein: 6.7 g/dL (ref 6.6–8.7)

## 2023-02-26 LAB — RESPIRATORY PANEL, MOLECULAR, WITH COVID-19
Adenovirus by PCR: NOT DETECTED
Bordetella parapertussis by PCR: NOT DETECTED
Bordetella pertussis by PCR: NOT DETECTED
Chlamydophilia pneumoniae by PCR: NOT DETECTED
Coronavirus 229E by PCR: NOT DETECTED
Coronavirus HKU1 by PCR: NOT DETECTED
Coronavirus NL63 by PCR: NOT DETECTED
Coronavirus OC43 by PCR: NOT DETECTED
Human Metapneumovirus by PCR: NOT DETECTED
Human Rhinovirus/Enterovirus by PCR: NOT DETECTED
Influenza A by PCR: NOT DETECTED
Influenza B by PCR: NOT DETECTED
Mycoplasma pneumoniae by PCR: NOT DETECTED
Parainfluenza Virus 1 by PCR: NOT DETECTED
Parainfluenza Virus 2 by PCR: NOT DETECTED
Parainfluenza Virus 3 by PCR: NOT DETECTED
Parainfluenza Virus 4 by PCR: NOT DETECTED
Respiratory Syncytial Virus by PCR: NOT DETECTED
SARS-CoV-2, PCR: NOT DETECTED

## 2023-02-26 LAB — PROTIME-INR
INR: 1.07 (ref 0.88–1.18)
Protime: 13.6 s (ref 12.0–14.6)

## 2023-02-26 LAB — BILIRUBIN, DIRECT: Bilirubin, Direct: 0.1 mg/dL (ref 0.0–0.3)

## 2023-02-26 LAB — T4, FREE: T4 Free: 0.82 ng/dL — ABNORMAL LOW (ref 0.93–1.70)

## 2023-02-26 LAB — LACTIC ACID: Lactic Acid: 2.1 mmol/L (ref 0.5–1.9)

## 2023-02-26 LAB — TSH: TSH: 1.12 u[IU]/mL (ref 0.270–4.200)

## 2023-02-26 LAB — PHOSPHORUS: Phosphorus: 3.1 mg/dL (ref 2.5–4.5)

## 2023-02-26 MED ORDER — BUTALBITAL-APAP-CAFFEINE 50-325-40 MG PO TABS
50-325-40 MG | Freq: Four times a day (QID) | ORAL | Status: AC | PRN
Start: 2023-02-26 — End: 2023-02-28
  Administered 2023-02-27: 15:00:00 1 via ORAL

## 2023-02-26 MED ORDER — LORAZEPAM 2 MG/ML IJ SOLN
2 MG/ML | Freq: Four times a day (QID) | INTRAMUSCULAR | Status: AC | PRN
Start: 2023-02-26 — End: 2023-02-28
  Administered 2023-02-26: 17:00:00 0.5 mg via INTRAVENOUS

## 2023-02-26 MED ORDER — BUSPIRONE HCL 10 MG PO TABS
10 | Freq: Two times a day (BID) | ORAL | Status: DC
Start: 2023-02-26 — End: 2023-02-28
  Administered 2023-02-27 – 2023-02-28 (×3): 10 mg via ORAL

## 2023-02-26 MED ORDER — LORAZEPAM 2 MG/ML IJ SOLN
2 | Freq: Four times a day (QID) | INTRAMUSCULAR | Status: DC | PRN
Start: 2023-02-26 — End: 2023-02-26

## 2023-02-26 MED ORDER — ACETAMINOPHEN 325 MG PO TABS
325 | Freq: Four times a day (QID) | ORAL | Status: DC | PRN
Start: 2023-02-26 — End: 2023-02-28

## 2023-02-26 MED ORDER — LORAZEPAM 2 MG/ML IJ SOLN
2 | INTRAMUSCULAR | Status: AC
Start: 2023-02-26 — End: ?

## 2023-02-26 MED ORDER — HYDROCODONE-ACETAMINOPHEN 10-325 MG PO TABS
10-325 | ORAL | Status: DC | PRN
Start: 2023-02-26 — End: 2023-02-28

## 2023-02-26 MED ORDER — LORAZEPAM 2 MG/ML IJ SOLN
2 MG/ML | Freq: Once | INTRAMUSCULAR | Status: AC
Start: 2023-02-26 — End: 2023-02-28

## 2023-02-26 MED ORDER — SODIUM CHLORIDE 0.9 % IV BOLUS
0.9 | Freq: Once | INTRAVENOUS | Status: AC
Start: 2023-02-26 — End: 2023-02-26
  Administered 2023-02-26: 16:00:00 2000 mL via INTRAVENOUS

## 2023-02-26 MED ORDER — BUTORPHANOL TARTRATE 2 MG/ML IJ SOLN
2 | Freq: Once | INTRAMUSCULAR | Status: DC
Start: 2023-02-26 — End: 2023-02-28

## 2023-02-26 MED FILL — LORAZEPAM 2 MG/ML IJ SOLN: 2 MG/ML | INTRAMUSCULAR | Qty: 1

## 2023-02-26 MED FILL — BUSPIRONE HCL 10 MG PO TABS: 10 MG | ORAL | Qty: 1

## 2023-02-26 MED FILL — HYDROCODONE-ACETAMINOPHEN 10-325 MG PO TABS: 10-325 MG | ORAL | Qty: 1

## 2023-02-26 MED FILL — BUTORPHANOL TARTRATE 2 MG/ML IJ SOLN: 2 MG/ML | INTRAMUSCULAR | Qty: 1

## 2023-02-26 NOTE — Progress Notes (Signed)
11:30 pm note  Called to evaluate fht's possible bradycardia  Sonogram done  infnat in breech position complete,  normal fluid, fht's 130's , mov noted  Fht's traced for 30 min, baseline appears 130's, marked variability ,  possible 2 decels but with external monitor and mov it is difficult to assess,   Abdomen and uterus soft  A: stable, [redacted] week gestation   Possible viral syndrome  Plan, keep npo, iv fluid, constant monitoring

## 2023-02-26 NOTE — Other (Signed)
Patient puking. New orders received per Dr. Sheryle Spray.

## 2023-02-26 NOTE — Progress Notes (Signed)
EEG complete

## 2023-02-26 NOTE — Other (Signed)
Patient to CT with Dr. Leonard Schwartz, this nurse, Frances Furbish, and Connye Burkitt, CRNA.

## 2023-02-26 NOTE — Other (Signed)
Patient down to MRI with this nurse and Frances Furbish at side. Patient is talking and in good spirits. She states that she is feeling better.

## 2023-02-26 NOTE — Progress Notes (Signed)
RRT called to [facility] triage room 20 at 1230. Arrived at pt bedside at 1231. Pt conscious and speaking. Cardiac monitoring placed. Sp02 monitor initially not functioning, while cable changed 10 L non rebreather placed on pt who remained pink with brisk capillary refill. SP02 100%. RRT team and hospitalist arrived. Remained at pt bedside while head CT ordered until scanner ready.  Traveled with pt, primary OB RN, and hospitalist to head CT. Pt monitored on ra, vss, 02 available, crash cart taken along with pt. Returned to [facility] unit without event.

## 2023-02-26 NOTE — Other (Signed)
Dr. Sheryle Spray at nurse station

## 2023-02-26 NOTE — Procedures (Signed)
PROCEDURE NOTE  Date: 02/26/2023   Name: [name]  Date of Birth: 1996-07-02    Procedures    Patient:   Krystal Bird  MR#:    [id number]   Room:    0220/0220-01   Date of Birth:   April 07, 1996  Date of Progress Note: 02/26/2023  Time of Note                           5:03 PM  Consulting Physician:   Elijah Birk, M.D.  Attending Physician:  Agapito Games, MD         This is a multichannel digital EEG recording using the international 10-20 placement system.     Date of study 713/24    Reason for exam: Spells    Technical Summary:     Background EEG activity: The occipital dominant rhythm is 10-11 Hz. There is much low to moderate voltage 8-10 Hz activity seen in a generalized distribution intermixed with low voltage 18-22 Hz activity.          IMPRESSION:   This is a normal awake EEG. No clear epileptiform activity was noted during the recording period. 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      Dr Elijah Birk  Board Certified in Neurology  Board Certified in Clinical Neurophysiology  Fellowship trained in Clinical Neurophysiology    EEG Run Time 35 minutes

## 2023-02-26 NOTE — H&P (Signed)
History and Physical    Patient's Name/Date of Birth: Krystal Bird / Aug 16, 1996, (26 y.o.), female    Date: February 26, 2023     Chief Complaint: fainting feeling    HPI:   27 yo g1p0 at 31 weeks, was in the prenatal class and felt dizzy, fainty. Last week pt fainted. She was seen in the er and work up was negative. Today she denies any uti uri cns or covid sympotoms, no leaking, no bleeding good fetal mov. Prenatal care unremarkable. Past medical hx negative, no surgeries,  no allergies.   Ros positive for poor nails, some cold intolerance    History reviewed. No pertinent past medical history.    No past surgical history on file.    Current Facility-Administered Medications   Medication Dose Route Frequency Provider Last Rate Last Admin    sodium chloride 0.9 % bolus 2,000 mL  2,000 mL IntraVENous Once Agapito Games, MD 991.7 mL/hr at 02/26/23 1132 2,000 mL at 02/26/23 1132    butorphanol (STADOL) injection 1 mg  1 mg IntraVENous Once Agapito Games, MD        LORazepam (ATIVAN) injection 0.5 mg  0.5 mg IntraVENous Q6H PRN Agapito Games, MD   0.5 mg at 02/26/23 1249       No Known Allergies    No family history on file.    Social History     Socioeconomic History    Marital status: Radiation protection practitioner     Spouse name: Not on file    Number of children: Not on file    Years of education: Not on file    Highest education level: Not on file   Occupational History    Not on file   Tobacco Use    Smoking status: Never    Smokeless tobacco: Not on file   Substance and Sexual Activity    Alcohol use: No    Drug use: No    Sexual activity: Not on file   Other Topics Concern    Not on file   Social History Narrative    Not on file     Social Determinants of Health     Financial Resource Strain: Not on file   Food Insecurity: Not on file (10/05/2022)   Transportation Needs: Not on file   Physical Activity: Not on file   Stress: Not on file   Social Connections: Not on file   Intimate Partner Violence: Not on file   Housing  Stability: Not on file       ROS: Non-contributory    Physical Exam:  Vitals:    02/26/23 1045 02/26/23 1046 02/26/23 1100 02/26/23 1145   BP: 125/73 115/70 114/64 (!) 112/58   Pulse: 79 76 71 62   SpO2: 95%        There is no height or weight on file to calculate BMI.    General: no acute distress, breathing without effort.   Neuro exam is normal,  motor , snesory sensorium, cn 2-12 wnl. Coherent oriented.   No deficits noted     HEENT: PEARLA, hearing normal, no abnormalities in the mouth    Chest: Breath sounds were clear and equal with no rales, wheezes, or rhonchi.  Respiratory effort was normal with no retractions or use of accessory muscles.    Cardiovascular: Heart sounds were normal with a regular rate and rhythm.  There were no murmurs or gallops.    Abdomen:  Bowel sounds were normal.  The abdomen  was soft and non distended.  There was no tenderness, guarding, rebound, or rigidity.  There was no masses, hepatosplenomegaly, or hernias.    Genitourinary: No inguinal hernias were noted on coughing and straining.    Pelvic: not done    Fht's cat 1, baseline 130's, accelerations present, no decels, no contractions    Assessment/Plan:  [redacted] week gestation  Dizziness  Addendum: on arrival pt received  orange juice and iv fluids. Pt complained of headache, vitals signs noted normal. Anxiety like event prompted  a rapid response call.  Ativan 05 mg given at 12:50     Electronically signed by Agapito Games, MD on 02/26/23 at 1:01 PM CDT

## 2023-02-26 NOTE — Other (Signed)
Patient reports headache that is on the right side. She states that it starts from the front and goes all the way to the back and down her neck. Patient states that it started this morning and has progressively gotten worse since then.

## 2023-02-26 NOTE — Anesthesia Pre-Procedure Evaluation (Deleted)
Department of Anesthesiology  Preprocedure Note       Name:  Krystal Bird   Age:  27 y.o.  DOB:  [birth year]                                          MRN:  [id number]         Date:  02/26/2023      Surgeon: Moishe Spice):  Agapito Games, MD    Procedure: Procedure(s):  CESAREAN SECTION    Medications prior to admission:   Prior to Admission medications    Medication Sig Start Date End Date Taking? Authorizing Provider   pantoprazole (PROTONIX) 20 MG tablet Take 1 tablet by mouth every morning (before breakfast) 02/08/23   Antony Salmon, APRN - CNM   sertraline (ZOLOFT) 50 MG tablet Take 1 tablet by mouth in the morning and at bedtime 01/13/23 02/12/23  Antony Salmon, APRN - CNM   ferrous sulfate (FE TABS 325) 325 (65 Fe) MG EC tablet Take 1 tablet by mouth every other day    [provider]   Prenat-B2-B6-B12-D3-FA (PRENA1 PO) Take by mouth    [provider]       Current medications:    Current Facility-Administered Medications   Medication Dose Route Frequency Provider Last Rate Last Admin   . butorphanol (STADOL) injection 1 mg  1 mg IntraVENous Once Agapito Games, MD       . LORazepam (ATIVAN) injection 0.5 mg  0.5 mg IntraVENous Q6H PRN Agapito Games, MD   0.5 mg at 02/26/23 1249   . acetaminophen (TYLENOL) tablet 650 mg  650 mg Oral Q6H PRN Bacanurschi, Evghenii, MD       . LORazepam (ATIVAN) injection 0.5 mg  0.5 mg IntraVENous Once Agapito Games, MD       . busPIRone (BUSPAR) tablet 10 mg  10 mg Oral BID Agapito Games, MD       . butalbital-acetaminophen-caffeine (FIORICET, ESGIC) per tablet 1 tablet  1 tablet Oral Q6H PRN Elijah Birk, MD       . HYDROcodone-acetaminophen (NORCO) 10-325 MG per tablet 1 tablet  1 tablet Oral Q4H PRN Siva, Niranjan, MD       . dextrose 5 % in lactated ringers infusion   IntraVENous Continuous Agapito Games, MD 75 mL/hr at 02/26/23 2021 New Bag at 02/26/23 2021   . betamethasone acetate-betamethasone sodium phosphate (CELESTONE) injection 12 mg   12 mg IntraMUSCular Daily Agapito Games, MD       . dextrose 5 % in lactated ringers infusion   IntraVENous Continuous Agapito Games, MD 100 mL/hr at 02/26/23 2216 Rate Change at 02/26/23 2216   . ondansetron (ZOFRAN) injection 4 mg  4 mg IntraVENous Q6H PRN Agapito Games, MD       . Melene Muller ON 02/27/2023] promethazine (PHENERGAN) 12.5 mg in sodium chloride 0.9 % 50 mL IVPB  12.5 mg IntraVENous Q6H PRN Agapito Games, MD         Facility-Administered Medications Ordered in Other Encounters   Medication Dose Route Frequency Provider Last Rate Last Admin   . lactated ringers IV soln infusion   IntraVENous Continuous PRN Adrian Saran, APRN - CRNA   New Bag at 02/26/23 2240       Allergies:  No Known Allergies    Problem List:    Patient Active Problem List   Diagnosis Code   .  [redacted] weeks gestation of pregnancy Z3A.31   . Spells of decreased attentiveness R68.89   . Anxiety F41.9   . Other headache syndrome G44.89       Past Medical History:  History reviewed. No pertinent past medical history.    Past Surgical History:  No past surgical history on file.    Social History:    Social History     Tobacco Use   . Smoking status: Never   . Smokeless tobacco: Not on file   Substance Use Topics   . Alcohol use: No                                Counseling given: Not Answered      Vital Signs (Current):   Vitals:    02/26/23 1637 02/26/23 1640 02/26/23 1643 02/26/23 1951   BP: (!) 116/49 (!) 119/50 (!) 111/47 (!) 105/49   Pulse: 65 65 69 71   Resp:    10   Temp:    98 F (36.7 C)   TempSrc:    Temporal   SpO2:    96%                                              BP Readings from Last 3 Encounters:   02/26/23 (!) 105/49   02/23/23 130/75   02/21/23 103/63       NPO Status:                                                                                 BMI:   Wt Readings from Last 3 Encounters:   02/23/23 104.3 kg (230 lb)   02/08/23 102.5 kg (226 lb)   01/13/23 100.2 kg (221 lb)     There is no height or weight on file  to calculate BMI.    CBC:   Lab Results   Component Value Date/Time    WBC 11.9 02/26/2023 12:40 PM    RBC 3.69 02/26/2023 12:40 PM    HGB 12.2 02/26/2023 12:40 PM    HCT 35.7 02/26/2023 12:40 PM    MCV 96.7 02/26/2023 12:40 PM    RDW 11.9 02/26/2023 12:40 PM    PLT 158 02/26/2023 12:40 PM       CMP:   Lab Results   Component Value Date/Time    NA 134 02/26/2023 12:40 PM    K 4.0 02/26/2023 12:40 PM    K 3.9 02/26/2023 11:11 AM    CL 101 02/26/2023 12:40 PM    CO2 18 02/26/2023 12:40 PM    BUN 7 02/26/2023 12:40 PM    CREATININE 0.4 02/26/2023 12:40 PM    LABGLOM >90 02/26/2023 12:40 PM    GLUCOSE 169 02/26/2023 12:40 PM    CALCIUM 8.3 02/26/2023 12:40 PM    BILITOT 0.2 02/26/2023 12:40 PM    ALKPHOS 82 02/26/2023 12:40 PM    AST 13 02/26/2023 12:40 PM    ALT 13 02/26/2023 12:40 PM  POC Tests: No results for input(s): "POCGLU", "POCNA", "POCK", "POCCL", "POCBUN", "POCHEMO", "POCHCT" in the last 72 hours.    Coags:   Lab Results   Component Value Date/Time    PROTIME 13.6 02/26/2023 12:40 PM    INR 1.07 02/26/2023 12:40 PM       HCG (If Applicable): No results found for: "PREGTESTUR", "PREGSERUM", "HCG", "HCGQUANT"     ABGs: No results found for: "PHART", "PO2ART", "PCO2ART", "HCO3ART", "BEART", "O2SATART"     Type & Screen (If Applicable):  No results found for: "LABABO"    Drug/Infectious Status (If Applicable):  Lab Results   Component Value Date/Time    HIV Non-reactive 10/04/2022 11:51 AM       COVID-19 Screening (If Applicable):   Lab Results   Component Value Date/Time    COVID19 Not Detected 02/26/2023 03:00 PM           Anesthesia Evaluation    Airway: Mallampati: III  TM distance: >3 FB   Neck ROM: full  Mouth opening: > = 3 FB   Dental:          Pulmonary:Negative Pulmonary ROS and normal exam  breath sounds clear to auscultation                             Cardiovascular:Negative CV ROS  Exercise tolerance: good (>4 METS)          Rhythm: regular  Rate: normal                    Neuro/Psych:   (+)  headaches:             ROS comment: PTSD  Panic attacks GI/Hepatic/Renal:   (+) GERD: poorly controlled          Endo/Other:                     Abdominal:             Vascular: negative vascular ROS.         Other Findings: Hx negative for the following: chronic back pain, disease/surgery or instrumentation, paresthesia, foot drop. No known autoimmmune or neuromuscular disease. No hx of blood clots of any form, clotting disorders, not on blood thinners.  Dentition and oral mucosa intact    Pt seen by neurology today, eeg today. Pseudoseizures       Anesthesia Plan      general     ASA 3     (Informed consent obtained for. Plan if safety and time allows: spinal, +/- mac,emergency plan: GA)        Anesthetic plan and risks discussed with patient.    Use of blood products discussed with patient whom.                  Adrian Saran, APRN - CRNA   02/26/2023

## 2023-02-26 NOTE — Other (Signed)
Orange juice given to the patient per Dr. Sheryle Spray

## 2023-02-26 NOTE — Other (Signed)
Patient having seizure-like activity in MRI. The patient is unresponsive, comes out of it and is slow to respond. Rapid called. Rapid team at patient side. VSS. Patient states that she thinks that she can do the other MRI that is 10 minutes. This nurse, along with Frances Furbish stays at patient side and transfers her to room 533. Report given to Harriett Sine, RN, who states that she has no questions.

## 2023-02-26 NOTE — Other (Signed)
Dr. Sheryle Spray notified on the patients headache. Neuro exam WNL- see flowsheet charting for further eval.

## 2023-02-26 NOTE — Other (Signed)
Patient's spouse called out stating that she was not feeling or acting right. This nurse, Chase Picket, RN, and Frances Furbish, RN at bedside. Patient appeared to be having seizure-like activity and was slow to respond. Dr. Sheryle Spray notified to come to bedside. Rapid response called at 1230. Patient was placed on monitors, per Connye Burkitt, CRNA and applied 02 at 10L via non-re breather

## 2023-02-26 NOTE — Other (Signed)
Rapid team at bedside.

## 2023-02-26 NOTE — Anesthesia Post-Procedure Evaluation (Deleted)
Department of Anesthesiology  Postprocedure Note    Patient: Krystal Bird  MRN: [id number]  Birthdate: November 27, 1995  Date of evaluation: 02/26/2023    Procedure Summary       Date: 02/26/23 Room / Location: [facility]    Anesthesia Start: 2240 Anesthesia Stop:     Procedure: CESAREAN SECTION (Abdomen) Diagnosis:     Surgeons: Agapito Games, MD Responsible Provider:     Anesthesia Type: general ASA Status: 3            Anesthesia Type: No value filed.    Aldrete Phase I:      Aldrete Phase II:      Anesthesia Post Evaluation    Patient location during evaluation: bedside (OB observation room)  Patient participation: complete - patient participated  Level of consciousness: awake and alert  Pain score: 0  Airway patency: patent  Nausea & Vomiting: no nausea (intermitent. none at this time)  Cardiovascular status: hemodynamically stable  Respiratory status: acceptable, spontaneous ventilation and room air  Hydration status: euvolemic  Comments: Procedure cancelled.   Pain management: adequate    No notable events documented.

## 2023-02-26 NOTE — Other (Signed)
RN up to 5th floor, room 533 to complete q shift NST. SVE completed by verbal order from on-call provider, Dr. Sheryle Spray. Pt was a dilated a fingertip, still thick and high, cervix midline and soft. Provider aware of cervical exam results. After 20 minutes of printing, strip stop printing. RN remained at bedside continuously monitoring with monitor in place. On-Call provider, Dr. Sheryle Spray sent video of strip in entirety as there were multiple variable decels noted on 20 minute strip. Per provider, Dr. Sheryle Spray, 'strip looked good so far, continue to keep her on the monitor for an additional hour'. RN discussed contraction pattern and patient's response to pain elicited from contractions and patient complaints of nausea/vomiting. Provider order given for Zofran/Pheregan/Reglan, enter NPO order, CBC, CMP for 0600 and IVF D5LR @ 100 mL Order requested from provider for Betamethasone and BPP @ 2153. Dr. Sheryle Spray provided order for Betamethasone, but no BPP due to his interpretation of strip as acceptable.  RN remained at bedside observing heart tone monitor. Strip printing resumed at 2213. At 2219, patient moved positions and RN lost tracing on monitor. RN remained continuously at bedside, audible tones present. At 2223, tones no longer audible. Contacted OB unit by phone for backup assistance in attempt to locate fetus heartbeat. Backup arrived to room 533 from OB, including Press photographer and other labor RN from [facility] unit at bedside with this RN with doppler. This RN and 2 other RN's attempted to locate fetal heartbeat with doppler and ultrasound FHM. Unable to locate heartbeat. Dr. Sheryle Spray, on-call provider contacted by telephone @ 2230, informed of loss of heart tones and notified provider that RN's were in route with patient back down to OB unit to OR to prepare for his arrival for c-section. Dr. Sheryle Spray en-route immediately to hospital. Charge RN, Almira Coaster, contacted Respiratory, Eartha Inch and Keri [place], NNP while in  elevator with patient en route to OR to notify them of circumstances @ 2232. CRNA, Connye Burkitt, already on unit in nurses station available for anesthesia. Dr. Sheryle Spray contacted RN back @ 2233 to ensure anesthesia had been notified, assured him they were already present in unit. Dr. Sheryle Spray in OR @ 2240. OR was opened by surgical tech and count completed. Pt on OR table with anesthesia ready for section shortly after time of providers arrival. Heart tones were back up by 2240 in the OR and monitored for 30 minutes by provider after provider conducted an ultrasound in the OR as heart tones were back. Decision made by provider, Dr. Sheryle Spray, after monitoring  and ultrasound, not to move forward with c-section due to movement being noted in the ultrasound and continuous FHT tracing for 30 minutes, baseline in 130's, marked variability, 2 potential decels on external monitor but provider Dr. Sheryle Spray had difficulty assessing decel versus fetal movement and loss of tracing. Per provider, Dr. Sheryle Spray, place patient back in OB unit and admit for constant monitoring, IV fluids, patient to remain NPO and leave indwelling foley catheter in place that was placed in OR. Pt transported by stretcher to obs room 220, waiting in-patient room to be cleaned by EVS. Once clean, patient moved to room 224 for in-patient admission.
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
## 2023-02-26 NOTE — Other (Signed)
Dr. Lenise Herald contacted via answer service.

## 2023-02-26 NOTE — Other (Signed)
Back from CT

## 2023-02-26 NOTE — Other (Signed)
Dr. Cardenas at bedside.

## 2023-02-27 ENCOUNTER — Ambulatory Visit: Admit: 2023-02-27 | Payer: BLUE CROSS/BLUE SHIELD

## 2023-02-27 LAB — MICROSCOPIC URINALYSIS
Bacteria, UA: NEGATIVE /HPF
Crystals, UA: NEGATIVE /HPF
Epithelial Cells, UA: 1 /HPF (ref 0–5)
Hyaline Casts, UA: 0 /HPF (ref 0–8)
RBC, UA: 2 /HPF (ref 0–4)
WBC, UA: 2 /HPF (ref 0–5)

## 2023-02-27 LAB — CBC
Hematocrit: 31.5 % — ABNORMAL LOW (ref 37.0–47.0)
Hemoglobin: 10.8 g/dL — ABNORMAL LOW (ref 12.0–16.0)
MCH: 33.2 pg — ABNORMAL HIGH (ref 27.0–31.0)
MCHC: 34.3 g/dL (ref 33.0–37.0)
MCV: 96.9 fL (ref 81.0–99.0)
MPV: 10.8 fL (ref 9.4–12.3)
Platelets: 155 10*3/uL (ref 130–400)
RBC: 3.25 M/uL — ABNORMAL LOW (ref 4.20–5.40)
RDW: 11.9 % (ref 11.5–14.5)
WBC: 11.6 10*3/uL — ABNORMAL HIGH (ref 4.8–10.8)

## 2023-02-27 LAB — CBC WITH AUTO DIFFERENTIAL
Basophils %: 0.2 % (ref 0.0–1.0)
Basophils Absolute: 0 10*3/uL (ref 0.00–0.20)
Eosinophils %: 0.2 % (ref 0.0–5.0)
Eosinophils Absolute: 0 10*3/uL (ref 0.00–0.60)
Hematocrit: 34.8 % — ABNORMAL LOW (ref 37.0–47.0)
Hemoglobin: 12.2 g/dL (ref 12.0–16.0)
Immature Granulocytes #: 0.1 10*3/uL
Lymphocytes %: 6.4 % — ABNORMAL LOW (ref 20.0–40.0)
Lymphocytes Absolute: 0.8 10*3/uL — ABNORMAL LOW (ref 1.1–4.5)
MCH: 34.1 pg — ABNORMAL HIGH (ref 27.0–31.0)
MCHC: 35.1 g/dL (ref 33.0–37.0)
MCV: 97.2 fL (ref 81.0–99.0)
MPV: 11.4 fL (ref 9.4–12.3)
Monocytes %: 2.2 % (ref 0.0–10.0)
Monocytes Absolute: 0.3 10*3/uL (ref 0.00–0.90)
Neutrophils %: 90.1 % — ABNORMAL HIGH (ref 50.0–65.0)
Neutrophils Absolute: 11.5 10*3/uL — ABNORMAL HIGH (ref 1.5–7.5)
Platelets: 165 10*3/uL (ref 130–400)
RBC: 3.58 M/uL — ABNORMAL LOW (ref 4.20–5.40)
RDW: 11.9 % (ref 11.5–14.5)
WBC: 12.8 10*3/uL — ABNORMAL HIGH (ref 4.8–10.8)

## 2023-02-27 LAB — URINALYSIS
Bilirubin, Urine: NEGATIVE
Glucose, Ur: NEGATIVE mg/dL
Ketones, Urine: NEGATIVE mg/dL
Leukocyte Esterase, Urine: NEGATIVE
Nitrite, Urine: NEGATIVE
Protein, UA: NEGATIVE mg/dL
Specific Gravity, UA: 1.015 (ref 1.005–1.030)
Urobilinogen, Urine: 1 E.U./dL (ref <2.0)
pH, Urine: 7.5 (ref 5.0–8.0)

## 2023-02-27 LAB — COMPREHENSIVE METABOLIC PANEL
ALT: 10 U/L (ref 5–33)
AST: 14 U/L (ref 5–32)
Albumin: 3.1 g/dL — ABNORMAL LOW (ref 3.5–5.2)
Alkaline Phosphatase: 76 U/L (ref 35–104)
Anion Gap: 12 mmol/L (ref 7–19)
BUN: 5 mg/dL — ABNORMAL LOW (ref 6–20)
CO2: 19 mmol/L — ABNORMAL LOW (ref 22–29)
Calcium: 8.6 mg/dL (ref 8.6–10.0)
Chloride: 103 mmol/L (ref 98–111)
Creatinine: 0.5 mg/dL (ref 0.5–0.9)
Est, Glom Filt Rate: >90 (ref >60)
Glucose: 103 mg/dL (ref 74–109)
Potassium: 4.3 mmol/L (ref 3.5–5.0)
Sodium: 134 mmol/L — ABNORMAL LOW (ref 136–145)
Total Bilirubin: 0.3 mg/dL (ref 0.2–1.2)
Total Protein: 5.9 g/dL — ABNORMAL LOW (ref 6.6–8.7)

## 2023-02-27 MED ORDER — ONDANSETRON HCL 4 MG/2ML IJ SOLN
42 MG/2ML | Freq: Four times a day (QID) | INTRAMUSCULAR | Status: AC | PRN
Start: 2023-02-27 — End: 2023-02-28

## 2023-02-27 MED ORDER — OXYCODONE-ACETAMINOPHEN 5-325 MG PO TABS
5-325 | ORAL | Status: DC | PRN
Start: 2023-02-27 — End: 2023-02-28
  Administered 2023-02-27: 06:00:00 1 via ORAL

## 2023-02-27 MED ORDER — OXYCODONE-ACETAMINOPHEN 5-325 MG PO TABS
5-325 | Freq: Once | ORAL | Status: AC
Start: 2023-02-27 — End: 2023-02-27
  Administered 2023-02-27: 19:00:00 1 via ORAL

## 2023-02-27 MED ORDER — PROMETHAZINE HCL 25 MG/ML IJ SOLN
25 | Freq: Four times a day (QID) | INTRAMUSCULAR | Status: DC | PRN
Start: 2023-02-27 — End: 2023-02-26

## 2023-02-27 MED ORDER — PROPOFOL 200 MG/20ML IV EMUL
200 | INTRAVENOUS | Status: AC
Start: 2023-02-27 — End: ?

## 2023-02-27 MED ORDER — DEXTROSE IN LACTATED RINGERS 5 % IV SOLN
5 % | INTRAVENOUS | Status: AC
Start: 2023-02-27 — End: 2023-02-28
  Administered 2023-02-27: 09:00:00 via INTRAVENOUS
  Administered 2023-02-28: 06:00:00 1000 via INTRAVENOUS

## 2023-02-27 MED ORDER — ONDANSETRON HCL 4 MG/2ML IJ SOLN
4 | INTRAMUSCULAR | Status: AC
Start: 2023-02-27 — End: ?

## 2023-02-27 MED ORDER — BETAMETHASONE SOD PHOS & ACET 6 (3-3) MG/ML IJ SUSP
63-3 (3-3) MG/ML | Freq: Every day | INTRAMUSCULAR | Status: AC
Start: 2023-02-27 — End: 2023-03-01
  Administered 2023-02-27 – 2023-02-28 (×2): 12 mg via INTRAMUSCULAR

## 2023-02-27 MED ORDER — ROCURONIUM BROMIDE 100 MG/10ML IV SOLN
100 | INTRAVENOUS | Status: AC
Start: 2023-02-27 — End: ?

## 2023-02-27 MED ORDER — PROMETHAZINE HCL 25 MG/ML IJ SOLN
25 | Freq: Four times a day (QID) | INTRAMUSCULAR | Status: DC | PRN
Start: 2023-02-27 — End: 2023-02-28
  Administered 2023-02-27: 06:00:00 12.5 mg via INTRAVENOUS

## 2023-02-27 MED ORDER — SERTRALINE HCL 50 MG PO TABS
50 | Freq: Two times a day (BID) | ORAL | Status: DC
Start: 2023-02-27 — End: 2023-02-28
  Administered 2023-02-28 (×2): 50 mg via ORAL

## 2023-02-27 MED ORDER — OXYTOCIN 30 UNITS IN 500 ML INFUSION
30 | INTRAVENOUS | Status: AC
Start: 2023-02-27 — End: ?

## 2023-02-27 MED ORDER — CEFAZOLIN SODIUM 1 G IJ SOLR
1 | INTRAMUSCULAR | Status: AC
Start: 2023-02-27 — End: ?

## 2023-02-27 MED ORDER — SUGAMMADEX SODIUM 500 MG/5ML IV SOLN
500 | INTRAVENOUS | Status: AC
Start: 2023-02-27 — End: ?

## 2023-02-27 MED ORDER — SUCCINYLCHOLINE CHLORIDE 200 MG/10ML IV SOSY
200 | INTRAVENOUS | Status: AC
Start: 2023-02-27 — End: ?

## 2023-02-27 MED ORDER — LACTATED RINGERS IV SOLN
INTRAVENOUS | Status: DC | PRN
Start: 2023-02-27 — End: 2023-02-26
  Administered 2023-02-27: 03:00:00 via INTRAVENOUS

## 2023-02-27 MED ORDER — ONDANSETRON HCL 4 MG/2ML IJ SOLN
4 | INTRAMUSCULAR | Status: DC | PRN
Start: 2023-02-27 — End: 2023-02-26
  Administered 2023-02-27: 03:00:00 4 via INTRAVENOUS

## 2023-02-27 MED ORDER — DEXTROSE IN LACTATED RINGERS 5 % IV SOLN
5 % | INTRAVENOUS | Status: AC
Start: 2023-02-27 — End: 2023-02-28
  Administered 2023-02-27: via INTRAVENOUS

## 2023-02-27 MED FILL — ONDANSETRON HCL 4 MG/2ML IJ SOLN: 4 MG/2ML | INTRAMUSCULAR | Qty: 2

## 2023-02-27 MED FILL — BUTALBITAL-APAP-CAFFEINE 50-325-40 MG PO TABS: 50-325-40 MG | ORAL | Qty: 1

## 2023-02-27 MED FILL — BETAMETHASONE SOD PHOS & ACET 6 (3-3) MG/ML IJ SUSP: 6 (3-3) MG/ML | INTRAMUSCULAR | Qty: 2

## 2023-02-27 MED FILL — PROMETHAZINE HCL 25 MG/ML IJ SOLN: 25 MG/ML | INTRAMUSCULAR | Qty: 0.5

## 2023-02-27 MED FILL — CEFAZOLIN SODIUM 1 G IJ SOLR: 1 g | INTRAMUSCULAR | Qty: 2000

## 2023-02-27 MED FILL — OXYCODONE-ACETAMINOPHEN 5-325 MG PO TABS: 5-325 MG | ORAL | Qty: 1

## 2023-02-27 MED FILL — BETAMETHASONE SOD PHOS & ACET 6 (3-3) MG/ML IJ SUSP: 6 (3-3) MG/ML | INTRAMUSCULAR | Qty: 1

## 2023-02-27 MED FILL — BRIDION 500 MG/5ML IV SOLN: 500 MG/5ML | INTRAVENOUS | Qty: 5

## 2023-02-27 MED FILL — SUCCINYLCHOLINE CHLORIDE 200 MG/10ML IV SOSY: 200 MG/10ML | INTRAVENOUS | Qty: 10

## 2023-02-27 MED FILL — SERTRALINE HCL 50 MG PO TABS: 50 MG | ORAL | Qty: 1

## 2023-02-27 MED FILL — DIPRIVAN 200 MG/20ML IV EMUL: 200 MG/20ML | INTRAVENOUS | Qty: 20

## 2023-02-27 MED FILL — OXYTOCIN 30 UNITS IN 500 ML INFUSION: 30 UNIT/500ML | INTRAVENOUS | Qty: 500

## 2023-02-27 MED FILL — BUSPIRONE HCL 10 MG PO TABS: 10 MG | ORAL | Qty: 1

## 2023-02-27 MED FILL — ROCURONIUM BROMIDE 100 MG/10ML IV SOLN: 100 MG/10ML | INTRAVENOUS | Qty: 10

## 2023-02-27 NOTE — Other (Signed)
Ice pack applied to pt head

## 2023-02-27 NOTE — Other (Signed)
Foley Cath removed.

## 2023-02-27 NOTE — Other (Signed)
Pt had episode upon standing, headache throbbing from top of right side of forehead down right side of face through the eye and into the teeth. Pt closed eyes while standing. RN assisted patient to bed and instructed patient not to get up without assistance and provided supplies to take bed bath and clean gown provided. Significant other at bedside assisting.

## 2023-02-27 NOTE — Progress Notes (Signed)
Pt reports feeling better  No vomiting, nausea when she drank apple juice  Vitals noted  Labs noted  Exam unremarkable  Fht's cat 1  Plan , advance care as ordered

## 2023-02-27 NOTE — Other (Signed)
U/s tech at bedside. Patient is reporting a bad headache again/

## 2023-02-27 NOTE — Other (Signed)
Patient's spouse called out and states that she isnt feeling well, felt that she was going to pass out. This nurse, Frances Furbish, RN and Murley, RN at bedside. Patient is slow to respond, sara steady was used to take patient back to the bed. VSS. Dr Sheryle Spray notified on the patients status.

## 2023-02-27 NOTE — Other (Signed)
Patient states that her headache pain decreased down to a 5/10 for 30 minutes and is now back up to a 7/10. She is throwing up from the headache hurting. Will contact Dr. Sheryle Spray.

## 2023-02-27 NOTE — Other (Signed)
Patient reports headache that is a 7/10. She states that it hurts so bad she can not move her head. Dr .Sheryle Spray called and made aware. New orders received .

## 2023-02-27 NOTE — Other (Signed)
Patient up to shower, talking and feeling good.
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
## 2023-02-27 NOTE — Other (Signed)
Dr. Sheryle Spray in unit, made aware of 2100 episode with headache. ENT consult ordered by provider to ensure no sinus involvement causing headaches. RN with Dr. Sheryle Spray to bedside for rounding. Pt now observed feeling better, talkative and laughing with RN and provider.

## 2023-02-28 MED FILL — SERTRALINE HCL 50 MG PO TABS: 50 MG | ORAL | Qty: 1

## 2023-02-28 MED FILL — BUSPIRONE HCL 10 MG PO TABS: 10 MG | ORAL | Qty: 1

## 2023-02-28 MED FILL — BETAMETHASONE SOD PHOS & ACET 6 (3-3) MG/ML IJ SUSP: 6 (3-3) MG/ML | INTRAMUSCULAR | Qty: 4

## 2023-02-28 NOTE — Discharge Summary (Signed)
Physician Discharge Summary     Patient ID:  Krystal Bird  [id number]  27 y.o.  1996/04/12    Admit date: 02/26/2023    Discharge date:  02/28/2023    Admitting Physician: Agapito Games, MD    Discharge Diagnoses: [redacted] weeks gestation of pregnancy [Z3A.31]  Syncope, near [R55], headache syndrome, unexplained       Discharged Condition: good    Procedures Performed: iv hydration, hospitalist consult, neurology consult, ct scan head, MRI head    Hospital Course: Patient was admitted due to feeling fainty,  pt had  cns symptoms like petit mal seizure, eeg was negative, ct scan  and mri head were negative,  After 48 hours pt is feeling better and able to function,       Discharge Exam:  Appears well, AVSS, abdomen soft, appropriately tender, nondistended, BS present,    Disposition:good    Patient Instructions:   Activity: ambulate in house, resume normal activity    Diet: regular    Wound Care: none needed    Discharge Medication:      Medication List        CONTINUE taking these medications      ferrous sulfate 325 (65 Fe) MG EC tablet  Commonly known as: FE TABS 325     pantoprazole 20 MG tablet  Commonly known as: PROTONIX  Take 1 tablet by mouth every morning (before breakfast)     PRENA1 PO     sertraline 50 MG tablet  Commonly known as: ZOLOFT  Take 1 tablet by mouth in the morning and at bedtime               Follow-up with Tyrone Sage CNM in  one week    Signed:  Agapito Games, MD  02/28/2023  1:44 PM

## 2023-02-28 NOTE — Care Coordination-Inpatient (Signed)
Social Worker Investment banker, operational provided the Pt with written documentation for community resources based on Pt SDOH scores and any additional community resources as needed. These resources will be provided to the Pt at D/C. Electronically signed by Phillips Hay on 02/28/2023 at 6:01 AM

## 2023-02-28 NOTE — Progress Notes (Signed)
Pt resting soundly in bed with husband. No distress noted.

## 2023-02-28 NOTE — Progress Notes (Signed)
Day 2 admission  Pt is feeling better  No dizziness able to walk normally, able to keep food down  Vitals noted  Exam unchanged  Nst cat 1  A: stable   Plan cancel ent  consult  Discharge home, follow up 1 week with Krystal Bird CNM

## 2023-02-28 NOTE — Progress Notes (Signed)
Pt discharged. Discharge instructions given and copies provided. Pt verbalized understanding. All questions answered. Husband at bedside. Pt denies feeling dizziness at this time.

## 2023-02-28 NOTE — Progress Notes (Signed)
Spoke with Dr. Sheryle Spray- report on pt's status given. Pt wishing to go home. Orders rec'd.

## 2023-02-28 NOTE — Discharge Instructions (Addendum)
[employer]er [employer], operational provided the Pt with written documentation for community resources based on Pt SDOH scores and any additional community resources as needed. These resources will be provided to the Pt at D/C. Electronically signed by Phillips Hay on 02/28/2023 at 6:01 AM     LABOR AND DELIVERY - OBSERVATION DISCHARGE INSTRUCTIONS    TERM LABOR: RETURN TO HOSPITAL IF:  __There is a leaking or sudden gush of fluid from the vagina (note color, odor, time and amount of fluid)    __ You have bright red vaginal bleeding    __You begin to have regular contractions, occuring every *** minutes, each contraction lasting 45-60 seconds for one hour. Time contractions from beginning of one to the beginning of the next. True contractions have a regular rate and become progressively closer. They are not changed by position change and are usually more uncomfortable when walking.    PRE-TERM LABOR  _x_Your gestational age is 63 weeks: term pregnancy starts at 37 weeks.  __Fill your prescription and take as directed.  __Bed rest (left lying position is best).  __Refrain from sexual intercourse until you see your physician again.  __No heavy lifting or strenuous activity.    RETURN TO HOSPITAL IF:  _x_Contractions occur 3-4 in any hour (every 10-15 minutes), maybe with or without pain.  _x_Rhythmic or constant menstrual-like cramps  x__Rhythmic or constant lower, dull backache may radiate to the sides or front. Increase or change in vaginal discharge, may be watery, pink, red or brown-tinged.    PRE-ECLAMPSIA  __Bed rest, left side lying position  __Elevate legs while sitting  __Maintain quiet, peaceful environment  __Eat well-balanced meals, no added salt, avoid processed lunch meats, canned soups, potato chips, etc.    SYMPTOMS THAT REQUIRE PROMPT REPORTING:  __Rapid gain in weight  __Persistent or severe headache  __Visual disturbances (spots, blurred)  __Nausea/vomiting, with or without upper abdominal pain.  __Increase or  persistent swelling (face puffiness, hands, legs, ankles)  __Decreased urinary output  __Drowsiness listlessness    NAUSEA/VOMITING HYPEREMESIS  __Eat small, frequent meals instead of three large meals  __Drink liquids (such as 7-up or ginger ale) between meals instead of with meals  __Eat a few crackers or toast before getting out of bed in the morning    URINARY TRACT INFECTION  __Fill your prescription and take as directed  __Drink 8-10 glasses of water, juice or decaffeinated beverages a day.  __Take two regular strength Tylenol every 4 hours as needed for pain or fever (NO ASPIRIN PRODUCTS)  __Cleanse vaginal area from front to back  __Completely empty bladder and avoid postponing urination  __Notify your physician of elevated temperature      LOW LYING/MARGINAL PLACENTA PREVIA  __No sexual intercourse  __No douching or tampon use  __No heavy lifting or strenuous activity  __No long trips without physician's consent  __Limited activity for __________________    RETURN TO HOSPITAL IMMEDIATELY IF:  __Vaginal bleeding, usually bright red and painless. May be intermittent, may come in gushes or continuous.    ABDOMINAL POST-TRAUMA INSTRUCTIONS  __Limited activity for __hours  __It is important to note baby activity, evidence on pre-term labor or bleeding and tight feeling abdomen  __If you notice a decrease in fetal movement , notify your physician  __Return to hospital IMMEDIATELY if vaginal bleeding starts, abdominal tenderness, or pain, a rigid/board-like appearance of abdomen    OTHER INSTRUCTIONS  _x_Keep appointment to see your attending physician for next scheduled visit.  NOTE: If you do not begin to feel better, or you have any questions, contact your physician or call [phone], or return to the hospital.

## 2023-02-28 NOTE — Progress Notes (Signed)
Pt ambulating in hallway with husband. Pt denies any dizziness. Ambulated with strong, steady gait. No complaints voiced.

## 2023-03-02 LAB — HEAVY METALS SCREEN
Arsenic: <10 ug/L (ref <=12.0)
Cadmium: <1 ug/L (ref <=5.0)
Lead: <2 ug/dL (ref <=4.9)
Mercury: <2.5 ug/L (ref <=10.0)

## 2023-03-02 NOTE — Patient Instructions (Signed)
Learning About When to Call Your Doctor During Pregnancy (After 20 Weeks)  Overview  It's common to have concerns about what might be a problem when you're pregnant. Most pregnancies don't have any serious problems. But it's still important to know when to call your doctor if you have certain symptoms or signs of labor.  These are general suggestions. Your doctor may give you some more information about when to call.  When to call your doctor (after 20 weeks)  Call 911  anytime you think you may need emergency care. For example, call if:  You have severe vaginal bleeding. This means you are soaking through a pad each hour for 2 or more hours.  You have sudden, severe pain in your belly.  You have chest pain, are short of breath, or cough up blood.  You passed out (lost consciousness).  You have a seizure.  You see or feel the umbilical cord.  You think you are about to deliver your baby and can't make it safely to the hospital or birthing center.  Call your doctor now or seek immediate medical care if:  You have vaginal bleeding.  You have belly pain.  You have a fever.  You are dizzy or lightheaded, or you feel like you may faint.  You have signs of a blood clot in your leg (called a deep vein thrombosis), such as:  Pain in the calf, back of the knee, thigh, or groin.  Swelling in your leg or groin.  A color change on the leg or groin. The skin may be reddish or purplish, depending on your usual skin color.  You have symptoms of preeclampsia, such as:  Sudden swelling of your face, hands, or feet.  New vision problems (such as dimness, blurring, or seeing spots).  A severe headache.  You have a sudden release of fluid from your vagina. (You think your water broke.)  You've been having regular contractions for an hour. This means that you've had at least 6 contractions within 1 hour, even after you change your position and drink fluids.  You notice that your baby has stopped moving or is moving less than  normal.  You have signs of heart failure, such as:  New or increased shortness of breath.  New or worse swelling in your legs, ankles, or feet.  Sudden weight gain, such as more than 2 to 3 pounds in a day or 5 pounds in a week.  Feeling so tired or weak that you cannot do your usual activities.  You have symptoms of a urinary tract infection. These may include:  Pain or burning when you urinate.  A frequent need to urinate without being able to pass much urine.  Pain in the flank, which is just below the rib cage and above the waist on either side of the back.  Blood in your urine.  Watch closely for changes in your health, and be sure to contact your doctor if:  You have vaginal discharge that smells bad.  You feel sad, anxious, or hopeless for more than a few days.  You have skin changes, such as a rash, itching, or a yellow color to your skin.  You have other concerns about your pregnancy.  If you have labor signs at 37 weeks or more  If you have signs of labor at 37 weeks or more, your doctor may tell you to call when your labor becomes more active. Symptoms of active labor include:  Contractions that are   regular.  Contractions that are less than 5 minutes apart.  Contractions that are hard to talk through.  Follow-up care is a key part of your treatment and safety. Be sure to make and go to all appointments, and call your doctor if you are having problems. It's also a good idea to know your test results and keep a list of the medicines you take.  Where can you learn more?  Go to https://www.healthwise.net/patientEd and enter N531 to learn more about "Learning About When to Call Your Doctor During Pregnancy (After 20 Weeks)."  Current as of: February 22, 2022               Content Version: 14.0   2006-2024 [employer].   Care instructions adapted under license by [facility]. If you have questions about a medical condition or this instruction, always ask your healthcare professional. [employer]wise,  Incorpor[employer] disclaims any warranty or liability for your use of this information.       Braxton Hicks Contractions: Care Instructions  Your Care Instructions     Braxton Hicks contractions prepare your uterus for labor. Think of them as a "warm-up" exercise that your body does. You may begin to feel them between the 28th and 30th weeks of your pregnancy. But they start as early as the 20th week.  Braxton Hicks contractions usually occur more often during the ninth month. They may go away when you are active and return when you rest. These contractions are like mild contractions of true labor, but they occur less often. (You feel fewer than 8 in an hour.) They don't cause your cervix to open.  It may be hard for you to tell the difference between Braxton Hicks contractions and true labor, especially in your first pregnancy.  Follow-up care is a key part of your treatment and safety. Be sure to make and go to all appointments, and call your doctor if you are having problems. It's also a good idea to know your test results and keep a list of the medicines you take.  How can you care for yourself at home?  Try a warm bath to help relieve muscle tension and reduce pain.  Change positions every 30 minutes. Take breaks if you must sit for a long time. Get up and walk around.  Drink plenty of water.  Taking short walks may help you feel better.  Your doctor needs to check any contractions that are getting stronger or closer together.  Where can you learn more?  Go to https://www.healthwise.net/patientEd and enter Z402 to learn more about "Braxton Hicks Contractions: Care Instructions."  Current as of: February 22, 2022               Content Version: 14.0   2006-2024 [employer].   Care instructions adapted under license by [facility]. If you have questions about a medical condition or this instruction, always ask your healthcare professional. [employer] disclaims any warranty or liability for your  use of this information.       Preparing for Childbirth: Care Instructions  Overview     You are getting close to the birth of your child. For months, you've been taking care of yourself and the baby. Now you can still take steps that will help you have a healthy labor and birth. You can take classes to help prepare for the birth. You also can talk with your doctor about what you would like to happen during your labor.  Changes happen   in the last 2 months of your pregnancy. Your baby becomes too big to move around easily inside the uterus and may seem to move less. At the end of your pregnancy, your baby probably will settle into a head-down position. You will likely feel some pressure in your pelvis as you get close to the birth.  You may notice times when your belly tightens and becomes firm to the touch, then relaxes. These are called Braxton Hicks contractions. They sometimes occur as often as every 10 to 20 minutes. These contractions usually stop when you are active. (True labor pains continue or increase if you move around.)  Rupture of your membranes ("breaking of the water") often is a sign that labor has started or is about to start. This happens when a hole or tear develops in the fluid-filled bag (amniotic sac) that surrounds and protects your baby. You may feel a huge gush of water or a steady trickle of fluid. Call your doctor or go to the hospital if you think this has happened. Contractions may start, or if you are already having contractions, they may get stronger.  Follow-up care is a key part of your treatment and safety. Be sure to make and go to all appointments, and call your doctor if you are having problems. It's also a good idea to know your test results and keep a list of the medicines you take.  How can you care for yourself at home?  Get plenty of rest.  Take childbirth classes with your partner or coach. You will learn relaxation exercises that are helpful during labor. You also will  learn what you can do to manage labor pain.  If you have other children, take a class to learn how to help them adjust to the new baby.  Develop a written birth plan, if you choose to, keeping in mind that labor is hard to predict and your plan may change after labor begins. Some of what a birth plan may address includes:  Where you would like to have your baby. This includes the building and the room. It could be the hospital's birthing room, a separate birthing center, or your own home.  Who you would like to assist with delivery of your baby. You may want your doctor, an obstetrician, or a certified nurse-midwife. Some women prefer a lay midwife or a doula to provide support before and after delivery.  Whether you want a doula, nurse, midwife, or childbirth educator to give you support from early labor until after childbirth.  What comfort measures you want. You may have to choose between walking around during labor or having the security of a heart monitor for your baby. You may want to listen to music during labor. You may know what position you want to be in (such as sitting, squatting, or reclining) for pushing.  What pain relief you would like. There are several choices, so be sure to talk with your doctor about them.  How you want you and your baby to spend the first few hours after birth.  You may want to keep your baby with you for at least 1 hour after birth for bonding and early breastfeeding.  You may want the hospital to delay some infant care steps, such as a vitamin K injection, so that you have calming time with your baby.  You may not want visitors, or you may want other family members there.  Know the early signs of labor, such as a steady   ache low in your back.  If you are going to a hospital or clinic to have your baby, have your bag ready to go.  Pack a nightgown, robe, panties, socks, and slippers. You may want to bring your own soap, shampoo, brush, toothbrush, and toothpaste. Bring your own  self-adhesive sanitary pads.  In your baby's bag, bring an outfit, small blanket, and diapers. Have your car seat ready to go.  When should you call for help?  Watch closely for changes in your health, and be sure to contact your doctor if you have any questions about preparing for childbirth.  Where can you learn more?  Go to https://www.healthwise.net/patientEd and enter H862 to learn more about "Preparing for Childbirth: Care Instructions."  Current as of: February 22, 2022               Content Version: 14.0   2006-2024 [employer].   Care instructions adapted under license by [facility]. If you have questions about a medical condition or this instruction, always ask your healthcare professional. [employer] disclaims any warranty or liability for your use of this information.       During Pregnancy: Exercises  Introduction  Here are some examples of exercises to do during your pregnancy. Start each exercise slowly. Ease off the exercise if you start to have pain.  Talk to your doctor about when you can start these exercises and which ones will work best for you.  How to do the exercises  Neck rotation    Sit up straight in a firm chair, or stand up straight. If you're standing, keep your feet about hip-width apart.  Keeping your chin level, turn your head to the right and hold for 15 to 30 seconds.  Turn your head to the left and hold for 15 to 30 seconds.  Repeat 2 to 4 times.  Neck stretch to the front    Sit up straight in a firm chair, or stand up straight. Look straight ahead. If you're standing, keep your feet about hip-width apart.  Slowly bend your head forward without moving your shoulders.  Hold for 15 to 30 seconds, then return to your starting position.  Repeat 2 to 4 times.  Back press    Stand with your back 10 to 12 inches away from a wall.  Lean into the wall until your back is against it. Press your lower back against the wall by pulling in your stomach muscles.  Slowly  slide down until your knees are slightly bent, pressing your lower back against the wall.  Hold for at least 6 seconds, then slide back up the wall.  Repeat 8 to 12 times.  Over time, work up to holding this position for as much as 1 minute.  Trunk twist    Sit on the floor with your legs crossed. If that's not comfortable, you can sit on a folded blanket so your bottom is a few inches off the floor. Or you can sit on a chair with your knees hip-width apart and your feet flat on the floor.  Reach your left hand toward your right knee. You can place your right hand at your side for support.  Slowly twist your body (trunk) to your right.  Relax and return to your starting position.  Repeat 2 to 4 times.  Switch your hands and twist to your left.  Repeat 2 to 4 times.  Pelvic rocking on hands and knees    Start on   your hands and knees. Place your wrists directly below your shoulders and your knees below your hips.  Breathe in slowly. Tuck your head downward and round your back up, making a curve with your back in the shape of the letter C. Hold this position for about 6 seconds.  Breathe out slowly and bring your head back up. Relax, keeping your back straight. (Don't allow it to curve toward the floor.) Hold for about 6 seconds.  Repeat 8 to 12 times, gently rocking your pelvis.  Pelvic tilt    Lie on your back with your knees bent and your feet flat on the floor.  Tighten your belly muscles by pulling your belly button in toward your spine. Press your lower back to the floor. You should feel your hips and pelvis rock back.  Hold for 6 seconds while breathing smoothly, and then relax.  Repeat 8 to 12 times.  Do this exercise only during the first 4 months of pregnancy. After this point, lying on your back is not recommended, because it can cause blood flow problems for you and your baby.  Backward stretch    Start on your hands and knees with your knees 8 to 10 inches apart, hands directly below your shoulders, and  arms and back straight.  Keeping your arms straight, slowly lower your buttocks toward your heels and tuck your head toward your knees. Hold for 15 to 30 seconds.  Slowly return to the starting position.  Repeat 2 to 4 times.  Forward bend    Sit comfortably in a chair, with your arms relaxed.  Slowly bend forward, allowing your arms to hang down. Lean only as far as you can without feeling discomfort or pressure on your belly.  Hold for 15 to 30 seconds and then slowly sit up straight.  Repeat 2 to 4 times or to your comfort level.  Donkey kick    Start on your hands and knees. Place your hands directly below your shoulders, and keep your arms straight.  Tighten your belly muscles by pulling your belly button in toward your spine. Keep breathing normally, and don't hold your breath.  Lift one knee and bring it toward your elbow.  Slowly extend that leg behind you without completely straightening it. Be careful not to let your hip drop down. Avoid arching your back.  Hold your leg behind you for about 6 seconds.  Return to your starting position.  Repeat 8 to 12 times for each leg.  Tailor sitting    Sit on the floor.  Bring your feet close to your body while crossing your ankles.  Keep your back straight. Relax your legs and let your knees drop toward the floor.  Hold this position for as long as you are comfortable.  Toe reach    Sit on the floor with your back straight, legs about 12 inches apart, and feet relaxed outward.  Stretch your hands forward toward your right foot, then sit up.  Stretch your hands straight forward, then sit up.  Stretch your hands forward toward your left foot, then sit up.  Hold each stretch for 15 to 30 seconds.  Repeat 2 to 4 times.  Follow-up care is a key part of your treatment and safety. Be sure to make and go to all appointments, and call your doctor if you are having problems. It's also a good idea to know your test results and keep a list of the medicines you take.  Current as    of: February 22, 2022               Content Version: 14.0   2006-2024 [employer], [employer].   Care instructions adapted under license by [facility]. If you have questions about a medical condition or this instruction, always ask your healthcare professional. [employer] disclaims any warranty or liability for your use of this information.       Group B Strep During Pregnancy: Care Instructions  Overview     Group B strep infection is caused by a type of bacteria. It's a different kind of bacteria than the kind that causes strep throat.  You may have this kind of bacteria in your body. Sometimes it may cause an infection, but most of the time it doesn't make you sick or cause symptoms. But if you pass the bacteria to your baby during the birth, it can cause serious health problems for your baby.  If you have this bacteria in your body, you will get antibiotics when you are in labor. Antibiotics help prevent problems for a newborn baby.  After birth, doctors will watch and may test your baby. If your baby tests positive for Group B strep, your baby will get antibiotics.  If you plan to breastfeed your baby, don't worry. It will be safe to breastfeed.  Follow-up care is a key part of your treatment and safety. Be sure to make and go to all appointments, and call your doctor if you are having problems. It's also a good idea to know your test results and keep a list of the medicines you take.  How can you care for yourself at home?  If your doctor has prescribed antibiotics, take them as directed. Do not stop taking them just because you feel better. You need to take the full course of antibiotics.  Tell your doctor if you are allergic to any antibiotic.  If you go into labor, or your water breaks, go to the hospital. Your doctor will give you antibiotics to help protect your baby from infection.  Tell the doctors and nurses if you have an allergy to penicillin.  Tell the doctors and nurses at the  hospital that you tested positive for group B strep.  When should you call for help?   Call your doctor now or seek immediate medical care if:    You have symptoms of a urinary tract infection. These may include:  Pain or burning when you urinate.  A frequent need to urinate without being able to pass much urine.  Pain in the flank, which is just below the rib cage and above the waist on either side of the back.  Blood in your urine.  A fever.     You think you are in labor or your water has broken.     You have pain in your belly or pelvis.   Watch closely for changes in your health, and be sure to contact your doctor if you have any problems.  Where can you learn more?  Go to https://www.healthwise.net/patientEd and enter M001 to learn more about "Group B Strep During Pregnancy: Care Instructions."  Current as of: January 25, 2022               Content Version: 14.0   2006-2024 [employer].   Care instructions adapted under license by [facility]. If you have questions about a medical condition or this instruction, always ask your healthcare professional. [employer] disclaims any warranty or liability   for your use of this information.       Back Pain During Pregnancy: Care Instructions  Overview     Back pain has many possible causes. It is often caused by problems with muscles and ligaments in your back. The extra weight during pregnancy can put stress on your back. Moving, lifting, standing, sitting, or sleeping in an awkward way also can strain your back. Back pain can also be a sign of labor. Although it may hurt a lot, back pain often improves on its own. Use good home treatment, and take care not to stress your back.  Follow-up care is a key part of your treatment and safety. Be sure to make and go to all appointments, and call your doctor if you are having problems. It's also a good idea to know your test results and keep a list of the medicines you take.  How can you care for  yourself at home?  Ask your doctor about taking acetaminophen (Tylenol) for pain. Do not take aspirin, ibuprofen (Advil, Motrin), or naproxen (Aleve).  Do not take two or more pain medicines at the same time unless the doctor told you to. Many pain medicines have acetaminophen, which is Tylenol. Too much acetaminophen (Tylenol) can be harmful.  Try heat or ice, whichever feels better. Apply it for 10 to 20 minutes at a time, several times a day. Put a thin cloth between the heat or ice and your skin. A warm bath or shower may also help.  Lie on your left side with your knees and hips bent and a pillow between your legs. This reduces stress on your back.  Try to avoid standing or sitting for too long or heavy lifting. If your job requires lots of standing, sitting, or heavy lifting, ask your employer if you can take short breaks or adjust your work activity. You can ask your doctor to write a note requesting these breaks or other adjustments.  Wear supportive, low-heeled shoes. Avoid flat or high-heeled shoes.  Try a belly support band. Supporting your belly can take the strain off your back.  Ask your doctor about how much exercise you can do. Regular exercise such as swimming, water aerobics, walking, or stretching can help with back pain.  Ask your doctor about exercises to stretch, strengthen, and relax your muscles. Your doctor may recommend physical therapy.  When should you call for help?   Call your doctor now or seek immediate medical care if:    You've been having regular contractions for an hour. This means that you've had at least 6 contractions within 1 hour, even after you change your position and drink fluids.     You have new numbness in your buttocks, genital or rectal areas, or legs.     You have a new loss of bowel or bladder control.     You have symptoms of a urinary tract infection. These may include:  Pain or burning when you urinate.  A frequent need to urinate without being able to pass much  urine.  Pain in the flank, which is just below the rib cage and above the waist on either side of the back.  Blood in your urine.   Watch closely for changes in your health, and be sure to contact your doctor if:    You do not get better as expected.   Where can you learn more?  Go to https://www.healthwise.net/patientEd and enter C696 to learn more about "Back Pain During Pregnancy:   Care Instructions."  Current as of: February 22, 2022               Content Version: 14.0   2006-2024 [employer].   Care instructions adapted under license by [facility]. If you have questions about a medical condition or this instruction, always ask your healthcare professional. [employer] disclaims any warranty or liability for your use of this information.

## 2023-03-03 LAB — CULTURE, BLOOD 1: Blood Culture, Routine: NO GROWTH

## 2023-03-03 LAB — CULTURE, BLOOD 2: Culture, Blood 2: NO GROWTH

## 2023-03-09 ENCOUNTER — Inpatient Hospital Stay
Admit: 2023-03-09 | Discharge: 2023-03-09 | Disposition: A | Discharge status: Home / Self Care | Payer: BLUE CROSS/BLUE SHIELD | Source: Home / Self Care | Attending: Women's Health | Admitting: Women's Health

## 2023-03-09 ENCOUNTER — Encounter
Admit: 2023-03-09 | Discharge: 2023-03-09 | Payer: BLUE CROSS/BLUE SHIELD | Attending: Women's Health | Primary: Pediatrics

## 2023-03-09 DIAGNOSIS — Z3A32 32 weeks gestation of pregnancy: Secondary | ICD-10-CM

## 2023-03-09 LAB — CBC WITH AUTO DIFFERENTIAL
Basophils %: 0.3 % (ref 0.0–1.0)
Basophils Absolute: 0 10*3/uL (ref 0.00–0.20)
Eosinophils %: 0.9 % (ref 0.0–5.0)
Eosinophils Absolute: 0.1 10*3/uL (ref 0.00–0.60)
Hematocrit: 34.6 % — ABNORMAL LOW (ref 37.0–47.0)
Hemoglobin: 11.9 g/dL — ABNORMAL LOW (ref 12.0–16.0)
Immature Granulocytes #: 0.1 10*3/uL
Lymphocytes %: 12.9 % — ABNORMAL LOW (ref 20.0–40.0)
Lymphocytes Absolute: 1.6 10*3/uL (ref 1.1–4.5)
MCH: 33.9 pg — ABNORMAL HIGH (ref 27.0–31.0)
MCHC: 34.4 g/dL (ref 33.0–37.0)
MCV: 98.6 fL (ref 81.0–99.0)
MPV: 10.9 fL (ref 9.4–12.3)
Monocytes %: 6 % (ref 0.0–10.0)
Monocytes Absolute: 0.7 10*3/uL (ref 0.00–0.90)
Neutrophils %: 79 % — ABNORMAL HIGH (ref 50.0–65.0)
Neutrophils Absolute: 9.6 10*3/uL — ABNORMAL HIGH (ref 1.5–7.5)
Platelets: 162 10*3/uL (ref 130–400)
RBC: 3.51 M/uL — ABNORMAL LOW (ref 4.20–5.40)
RDW: 12 % (ref 11.5–14.5)
WBC: 12.2 10*3/uL — ABNORMAL HIGH (ref 4.8–10.8)

## 2023-03-09 LAB — URINALYSIS
Bilirubin, Urine: NEGATIVE
Blood, Urine: NEGATIVE
Glucose, Ur: NEGATIVE mg/dL
Leukocyte Esterase, Urine: NEGATIVE
Nitrite, Urine: NEGATIVE
Protein, UA: NEGATIVE mg/dL
Specific Gravity, UA: 1.013 (ref 1.005–1.030)
Urobilinogen, Urine: 1 E.U./dL (ref <2.0)
pH, Urine: 7 (ref 5.0–8.0)

## 2023-03-09 LAB — COMPREHENSIVE METABOLIC PANEL W/ REFLEX TO MG FOR LOW K
ALT: 8 U/L (ref 5–33)
AST: 10 U/L (ref 5–32)
Albumin: 3.3 g/dL — ABNORMAL LOW (ref 3.5–5.2)
Alkaline Phosphatase: 86 U/L (ref 35–104)
Anion Gap: 12 mmol/L (ref 7–19)
BUN: 8 mg/dL (ref 6–20)
CO2: 21 mmol/L — ABNORMAL LOW (ref 22–29)
Calcium: 8.5 mg/dL — ABNORMAL LOW (ref 8.6–10.0)
Chloride: 100 mmol/L (ref 98–111)
Creatinine: 0.5 mg/dL (ref 0.5–0.9)
Est, Glom Filt Rate: >90 (ref >60)
Glucose: 82 mg/dL (ref 74–109)
Potassium reflex Magnesium: 4.5 mmol/L (ref 3.5–5.0)
Sodium: 133 mmol/L — ABNORMAL LOW (ref 136–145)
Total Bilirubin: 0.2 mg/dL (ref 0.2–1.2)
Total Protein: 6 g/dL — ABNORMAL LOW (ref 6.6–8.7)

## 2023-03-09 LAB — PROTEIN, URINE, RANDOM: Protein, Ur: 9 mg/dL — ABNORMAL LOW (ref 15–45)

## 2023-03-09 LAB — CREATININE, RANDOM URINE: Creatinine, Ur: 66.4 mg/dL (ref 28.0–217.0)

## 2023-03-09 MED ORDER — LACTATED RINGERS IV BOLUS
Freq: Once | INTRAVENOUS | Status: AC
Start: 2023-03-09 — End: 2023-03-09
  Administered 2023-03-09: 17:00:00 1000 mL via INTRAVENOUS

## 2023-03-09 NOTE — Other (Signed)
Order received from Jolayne Panther, CNM to discharge home.

## 2023-03-09 NOTE — Progress Notes (Signed)
CNM Prenatal Office Note  Subjective:  Krystal Bird is here for a return obstetrical visit. Today she is [redacted]w[redacted]d weeks EGA.  Pt does feel fetal movement regularly. Denies headaches, RUQ pain, or visual changes as well as contractions, vaginal bleeding or leaking of fluid.     Problems/complaints today:  None  Objective:  Mother's Prenatal Vitals  BP: 126/82  Weight - Scale: 103.4 kg (228 lb)  Pulse: 63  Patient Position: Sitting  Prenatal Fetal Information  Fundal Height (cm): 34 cm  Fetal HR: 152  Movement: Present  Pt is A&Ox3, in no acute distress. Normocephalic, atraumatic. PERRL. Resp even and non-labored. Skin pink, warm & dry. Gravid abdomen. MAE's well. Gait steady.   Assessment:    IUP at [redacted]w[redacted]d wks      Diagnosis Orders   1. [redacted] weeks gestation of pregnancy        2. Encounter for prenatal care of first pregnancy, third trimester          Plan:  Problems/Complaints Management Plan:  None  Routine OB Management Plan:  Pt counseled on balanced nutrition, adequate fluid intake, taking PNV daily, and exercise along with GHTN precautions, Kick count, and Preterm labor  Continue with routine prenatal care.  Antenatal surveillance not indicated  RTC in 1 wks for prenatal visit    MEDICATIONS:  No orders of the defined types were placed in this encounter.    ORDERS:  No orders of the defined types were placed in this encounter.      More than 50% of this 20 min visit was education and counseling.      I Edison Simon, LPN, am scribing for and in the presence of Antony Salmon, [place]  03/09/23  11:27 AM CDT   I have seen and examined the patient independently.  I reviewed all laboratory and imaging studies that are relevant.  I have reviewed and made any appropriate changes to the HPI.    Electronically signed by Antony Salmon, APRN - CNM on 03/09/23  at 5:19 PM CDT

## 2023-03-09 NOTE — Progress Notes (Signed)
Pt presents today for routine prenatal visit. Pt denies vaginal bleeding, cramping, or leaking of fluid +fetal movement.     Pt stated that she passed out this morning at the chiropractor he said she needs carotid fetal doppler. Pt has had a lot of bad days, states something is not right.

## 2023-03-09 NOTE — Discharge Instructions (Signed)
LABOR AND DELIVERY - OBSERVATION DISCHARGE INSTRUCTIONS    TERM LABOR: RETURN TO HOSPITAL IF:  __There is a leaking or sudden gush of fluid from the vagina (note color, odor, time and amount of fluid)    __ You have bright red vaginal bleeding    __You begin to have regular contractions, occuring every *** minutes, each contraction lasting 45-60 seconds for one hour. Time contractions from beginning of one to the beginning of the next. True contractions have a regular rate and become progressively closer. They are not changed by position change and are usually more uncomfortable when walking.    PRE-TERM LABOR  __Your gestational age is *** weeks: term pregnancy starts at 37 weeks.  __Fill your prescription and take as directed.  __Bed rest (left lying position is best).  __Refrain from sexual intercourse until you see your physician again.  __No heavy lifting or strenuous activity.    RETURN TO HOSPITAL IF:  __Contractions occur 3-4 in any hour (every 10-15 minutes), maybe with or without pain.  __Rhythmic or constant menstrual-like cramps  __Rhythmic or constant lower, dull backache may radiate to the sides or front. Increase or change in vaginal discharge, may be watery, pink, red or brown-tinged.    PRE-ECLAMPSIA  __Bed rest, left side lying position  __Elevate legs while sitting  __Maintain quiet, peaceful environment  __Eat well-balanced meals, no added salt, avoid processed lunch meats, canned soups, potato chips, etc.    SYMPTOMS THAT REQUIRE PROMPT REPORTING:  __Rapid gain in weight  __Persistent or severe headache  __Visual disturbances (spots, blurred)  __Nausea/vomiting, with or without upper abdominal pain.  __Increase or persistent swelling (face puffiness, hands, legs, ankles)  __Decreased urinary output  __Drowsiness listlessness    NAUSEA/VOMITING HYPEREMESIS  __Eat small, frequent meals instead of three large meals  __Drink liquids (such as 7-up or ginger ale) between meals instead of with  meals  __Eat a few crackers or toast before getting out of bed in the morning    URINARY TRACT INFECTION  __Fill your prescription and take as directed  __Drink 8-10 glasses of water, juice or decaffeinated beverages a day.  __Take two regular strength Tylenol every 4 hours as needed for pain or fever (NO ASPIRIN PRODUCTS)  __Cleanse vaginal area from front to back  __Completely empty bladder and avoid postponing urination  __Notify your physician of elevated temperature      LOW LYING/MARGINAL PLACENTA PREVIA  __No sexual intercourse  __No douching or tampon use  __No heavy lifting or strenuous activity  __No long trips without physician's consent  __Limited activity for __________________    RETURN TO HOSPITAL IMMEDIATELY IF:  __Vaginal bleeding, usually bright red and painless. May be intermittent, may come in gushes or continuous.    ABDOMINAL POST-TRAUMA INSTRUCTIONS  __Limited activity for __hours  __It is important to note baby activity, evidence on pre-term labor or bleeding and tight feeling abdomen  __If you notice a decrease in fetal movement , notify your physician  __Return to hospital IMMEDIATELY if vaginal bleeding starts, abdominal tenderness, or pain, a rigid/board-like appearance of abdomen    OTHER INSTRUCTIONS  __Keep appointment to see your attending physician for ***       NOTE: If you do not begin to feel better, or you have any questions, contact your physician or call [phone], or return to the hospital.

## 2023-03-09 NOTE — Other (Signed)
Pt sent from office for one liter of iv fluids and lab work.  Order received that we do not have to do fetal monitoring.  Pt denies contractions, lof, and reports positive fetal movement.

## 2023-03-10 NOTE — Nursing Note (Signed)
Formatting of this note might be different from the original.  Patient came to the floor with significant other for NST.     NST reactive, seen by Dr. Jesus Genera, can go home.  Electronically signed by Juluis Rainier, RN at 03/10/2023  5:00 PM CDT

## 2023-03-10 NOTE — Nursing Note (Signed)
Formatting of this note might be different from the original.  Patient left the floor with significant other, discharge papers given, advised to come to ER if needed.  Electronically signed by Juluis Rainier, RN at 03/10/2023  5:01 PM CDT

## 2023-03-13 ENCOUNTER — Inpatient Hospital Stay
Admit: 2023-03-13 | Discharge: 2023-03-13 | Disposition: A | Discharge status: Home / Self Care | Payer: BLUE CROSS/BLUE SHIELD

## 2023-03-13 DIAGNOSIS — R42 Dizziness and giddiness: Secondary | ICD-10-CM

## 2023-03-13 LAB — URINALYSIS
Bilirubin, Urine: NEGATIVE
Blood, Urine: NEGATIVE
Glucose, Ur: NEGATIVE mg/dL
Ketones, Urine: NEGATIVE mg/dL
Leukocyte Esterase, Urine: NEGATIVE
Nitrite, Urine: NEGATIVE
Protein, UA: NEGATIVE mg/dL
Specific Gravity, UA: 1.008 (ref 1.005–1.030)
Urobilinogen, Urine: 0.2 E.U./dL (ref <2.0)
pH, Urine: 7.5 (ref 5.0–8.0)

## 2023-03-13 MED ORDER — LACTATED RINGERS IV BOLUS
Freq: Once | INTRAVENOUS | Status: AC
Start: 2023-03-13 — End: 2023-03-13
  Administered 2023-03-13: 18:00:00 1000 mL via INTRAVENOUS

## 2023-03-13 NOTE — Other (Signed)
Pt ambulated to labor and delivery, reports she is here for "fluids, feels dizzy and has had a headache since July 1 when 3rd trimester started"  denies leaking of fluid, denies vaginal bleeding, + fetal movement, TOCO and EFM placed to soft non-tender abd.

## 2023-03-13 NOTE — Discharge Instructions (Signed)
LABOR AND DELIVERY - OBSERVATION DISCHARGE INSTRUCTIONS    TERM LABOR: RETURN TO HOSPITAL IF:  __There is a leaking or sudden gush of fluid from the vagina (note color, odor, time and amount of fluid)    __ You have bright red vaginal bleeding    __You begin to have regular contractions, occuring every 4-5 minutes, each contraction lasting 45-60 seconds for one hour. Time contractions from beginning of one to the beginning of the next. True contractions have a regular rate and become progressively closer. They are not changed by position change and are usually more uncomfortable when walking.    PRE-TERM LABOR  __Your gestational age is 33.1 weeks: term pregnancy starts at 37 weeks.  __Fill your prescription and take as directed.  __Bed rest (left lying position is best).  __Refrain from sexual intercourse until you see your physician again.  __No heavy lifting or strenuous activity.    RETURN TO HOSPITAL IF:  __Contractions occur 3-4 in any hour (every 10-15 minutes), maybe with or without pain.  __Rhythmic or constant menstrual-like cramps  __Rhythmic or constant lower, dull backache may radiate to the sides or front. Increase or change in vaginal discharge, may be watery, pink, red or brown-tinged.    PRE-ECLAMPSIA  __Bed rest, left side lying position  __Elevate legs while sitting  __Maintain quiet, peaceful environment  __Eat well-balanced meals, no added salt, avoid processed lunch meats, canned soups, potato chips, etc.    SYMPTOMS THAT REQUIRE PROMPT REPORTING:  __Rapid gain in weight  __Persistent or severe headache  __Visual disturbances (spots, blurred)  __Nausea/vomiting, with or without upper abdominal pain.  __Increase or persistent swelling (face puffiness, hands, legs, ankles)  __Decreased urinary output  __Drowsiness listlessness    NAUSEA/VOMITING HYPEREMESIS  __Eat small, frequent meals instead of three large meals  __Drink liquids (such as 7-up or ginger ale) between meals instead of with  meals  __Eat a few crackers or toast before getting out of bed in the morning    URINARY TRACT INFECTION  __Fill your prescription and take as directed  __Drink 8-10 glasses of water, juice or decaffeinated beverages a day.  __Take two regular strength Tylenol every 4 hours as needed for pain or fever (NO ASPIRIN PRODUCTS)  __Cleanse vaginal area from front to back  __Completely empty bladder and avoid postponing urination  __Notify your physician of elevated temperature      LOW LYING/MARGINAL PLACENTA PREVIA  __No sexual intercourse  __No douching or tampon use  __No heavy lifting or strenuous activity  __No long trips without physician's consent  __Limited activity for __________________    RETURN TO HOSPITAL IMMEDIATELY IF:  __Vaginal bleeding, usually bright red and painless. May be intermittent, may come in gushes or continuous.    ABDOMINAL POST-TRAUMA INSTRUCTIONS  __Limited activity for __hours  __It is important to note baby activity, evidence on pre-term labor or bleeding and tight feeling abdomen  __If you notice a decrease in fetal movement , notify your physician  __Return to hospital IMMEDIATELY if vaginal bleeding starts, abdominal tenderness, or pain, a rigid/board-like appearance of abdomen    OTHER INSTRUCTIONS  __Keep appointment to see your attending physician for all your pregnancy needs       NOTE: If you do not begin to feel better, or you have any questions, contact your physician or call [phone], or return to the hospital.

## 2023-03-13 NOTE — Other (Signed)
Discharge instructions provided and reviewed, pt states understanding with all questions answered.

## 2023-03-16 ENCOUNTER — Encounter: Admit: 2023-03-16 | Discharge: 2023-03-16 | Payer: BLUE CROSS/BLUE SHIELD | Attending: Women's Health

## 2023-03-16 DIAGNOSIS — Z3A33 33 weeks gestation of pregnancy: Secondary | ICD-10-CM

## 2023-03-16 NOTE — Progress Notes (Signed)
CNM Prenatal Office Note  Subjective:  Krystal Bird is here for a return obstetrical visit. Today she is [redacted]w[redacted]d weeks EGA.  Pt does feel fetal movement regularly. Denies headaches, RUQ pain, or visual changes as well as contractions, vaginal bleeding or leaking of fluid.     Problems/complaints today:  None  Objective:  Mother's Prenatal Vitals  BP: 124/69  Weight - Scale: 103.9 kg (229 lb)  Pulse: 74  Patient Position: Sitting  Prenatal Fetal Information  Fetal HR: 141-tpg  Movement: Present  Pt is A&Ox3, in no acute distress. Normocephalic, atraumatic. PERRL. Resp even and non-labored. Skin pink, warm & dry. Gravid abdomen. MAE's well. Gait steady.   Assessment:    IUP at [redacted]w[redacted]d wks      Diagnosis Orders   1. [redacted] weeks gestation of pregnancy        2. Encounter for prenatal care of first pregnancy, third trimester          Plan:  Problems/Complaints Management Plan:  None  Routine OB Management Plan:  Pt counseled on balanced nutrition, adequate fluid intake, taking PNV daily, and exercise along with GHTN precautions, Kick count, and Preterm labor  Continue with routine prenatal care.  Antenatal surveillance indicated for BMI  RTC in 1 wks for prenatal visit    MEDICATIONS:  No orders of the defined types were placed in this encounter.    ORDERS:  No orders of the defined types were placed in this encounter.      More than 50% of this 20 min visit was education and counseling.      I Edison Simon, LPN, am scribing for and in the presence of Antony Salmon, [place]  03/16/23  8:42 AM CDT   I have seen and examined the patient independently.  I reviewed all laboratory and imaging studies that are relevant.  I have reviewed and made any appropriate changes to the HPI.    Electronically signed by Antony Salmon, APRN - CNM on 03/16/23  at 12:54 PM CDT

## 2023-03-16 NOTE — Progress Notes (Signed)
Pt presents today for routine prenatal visit. Pt denies vaginal bleeding, cramping, or leaking of fluid +fetal movement.     Pt c/o hot flashes and dripping in sweat. Pt also wants to discuss induction mentioned at last visit. Pt also wants to discuss ADHD medications. Pt also requesting fluids 2-3 times a week, can that be done in [place].

## 2023-03-16 NOTE — Patient Instructions (Signed)
Learning About When to Call Your Doctor During Pregnancy (After 20 Weeks)  Overview  It's common to have concerns about what might be a problem when you're pregnant. Most pregnancies don't have any serious problems. But it's still important to know when to call your doctor if you have certain symptoms or signs of labor.  These are general suggestions. Your doctor may give you some more information about when to call.  When to call your doctor (after 20 weeks)  Call 911  anytime you think you may need emergency care. For example, call if:  You have severe vaginal bleeding. This means you are soaking through a pad each hour for 2 or more hours.  You have sudden, severe pain in your belly.  You have chest pain, are short of breath, or cough up blood.  You passed out (lost consciousness).  You have a seizure.  You see or feel the umbilical cord.  You think you are about to deliver your baby and can't make it safely to the hospital or birthing center.  Call your doctor now or seek immediate medical care if:  You have vaginal bleeding.  You have belly pain.  You have a fever.  You are dizzy or lightheaded, or you feel like you may faint.  You have signs of a blood clot in your leg (called a deep vein thrombosis), such as:  Pain in the calf, back of the knee, thigh, or groin.  Swelling in your leg or groin.  A color change on the leg or groin. The skin may be reddish or purplish, depending on your usual skin color.  You have symptoms of preeclampsia, such as:  Sudden swelling of your face, hands, or feet.  New vision problems (such as dimness, blurring, or seeing spots).  A severe headache.  You have a sudden release of fluid from your vagina. (You think your water broke.)  You've been having regular contractions for an hour. This means that you've had at least 6 contractions within 1 hour, even after you change your position and drink fluids.  You notice that your baby has stopped moving or is moving less than  normal.  You have signs of heart failure, such as:  New or increased shortness of breath.  New or worse swelling in your legs, ankles, or feet.  Sudden weight gain, such as more than 2 to 3 pounds in a day or 5 pounds in a week.  Feeling so tired or weak that you cannot do your usual activities.  You have symptoms of a urinary tract infection. These may include:  Pain or burning when you urinate.  A frequent need to urinate without being able to pass much urine.  Pain in the flank, which is just below the rib cage and above the waist on either side of the back.  Blood in your urine.  Watch closely for changes in your health, and be sure to contact your doctor if:  You have vaginal discharge that smells bad.  You feel sad, anxious, or hopeless for more than a few days.  You have skin changes, such as a rash, itching, or a yellow color to your skin.  You have other concerns about your pregnancy.  If you have labor signs at 37 weeks or more  If you have signs of labor at 37 weeks or more, your doctor may tell you to call when your labor becomes more active. Symptoms of active labor include:  Contractions that are   regular.  Contractions that are less than 5 minutes apart.  Contractions that are hard to talk through.  Follow-up care is a key part of your treatment and safety. Be sure to make and go to all appointments, and call your doctor if you are having problems. It's also a good idea to know your test results and keep a list of the medicines you take.  Where can you learn more?  Go to https://www.healthwise.net/patientEd and enter N531 to learn more about "Learning About When to Call Your Doctor During Pregnancy (After 20 Weeks)."  Current as of: February 22, 2022               Content Version: 14.0   2006-2024 [employer].   Care instructions adapted under license by [facility]. If you have questions about a medical condition or this instruction, always ask your healthcare professional. [employer]wise,  Incorpor[employer] disclaims any warranty or liability for your use of this information.       Braxton Hicks Contractions: Care Instructions  Your Care Instructions     Braxton Hicks contractions prepare your uterus for labor. Think of them as a "warm-up" exercise that your body does. You may begin to feel them between the 28th and 30th weeks of your pregnancy. But they start as early as the 20th week.  Braxton Hicks contractions usually occur more often during the ninth month. They may go away when you are active and return when you rest. These contractions are like mild contractions of true labor, but they occur less often. (You feel fewer than 8 in an hour.) They don't cause your cervix to open.  It may be hard for you to tell the difference between Braxton Hicks contractions and true labor, especially in your first pregnancy.  Follow-up care is a key part of your treatment and safety. Be sure to make and go to all appointments, and call your doctor if you are having problems. It's also a good idea to know your test results and keep a list of the medicines you take.  How can you care for yourself at home?  Try a warm bath to help relieve muscle tension and reduce pain.  Change positions every 30 minutes. Take breaks if you must sit for a long time. Get up and walk around.  Drink plenty of water.  Taking short walks may help you feel better.  Your doctor needs to check any contractions that are getting stronger or closer together.  Where can you learn more?  Go to https://www.healthwise.net/patientEd and enter Z402 to learn more about "Braxton Hicks Contractions: Care Instructions."  Current as of: February 22, 2022               Content Version: 14.0   2006-2024 [employer].   Care instructions adapted under license by [facility]. If you have questions about a medical condition or this instruction, always ask your healthcare professional. [employer] disclaims any warranty or liability for your  use of this information.       Preparing for Childbirth: Care Instructions  Overview     You are getting close to the birth of your child. For months, you've been taking care of yourself and the baby. Now you can still take steps that will help you have a healthy labor and birth. You can take classes to help prepare for the birth. You also can talk with your doctor about what you would like to happen during your labor.  Changes happen   in the last 2 months of your pregnancy. Your baby becomes too big to move around easily inside the uterus and may seem to move less. At the end of your pregnancy, your baby probably will settle into a head-down position. You will likely feel some pressure in your pelvis as you get close to the birth.  You may notice times when your belly tightens and becomes firm to the touch, then relaxes. These are called Braxton Hicks contractions. They sometimes occur as often as every 10 to 20 minutes. These contractions usually stop when you are active. (True labor pains continue or increase if you move around.)  Rupture of your membranes ("breaking of the water") often is a sign that labor has started or is about to start. This happens when a hole or tear develops in the fluid-filled bag (amniotic sac) that surrounds and protects your baby. You may feel a huge gush of water or a steady trickle of fluid. Call your doctor or go to the hospital if you think this has happened. Contractions may start, or if you are already having contractions, they may get stronger.  Follow-up care is a key part of your treatment and safety. Be sure to make and go to all appointments, and call your doctor if you are having problems. It's also a good idea to know your test results and keep a list of the medicines you take.  How can you care for yourself at home?  Get plenty of rest.  Take childbirth classes with your partner or coach. You will learn relaxation exercises that are helpful during labor. You also will  learn what you can do to manage labor pain.  If you have other children, take a class to learn how to help them adjust to the new baby.  Develop a written birth plan, if you choose to, keeping in mind that labor is hard to predict and your plan may change after labor begins. Some of what a birth plan may address includes:  Where you would like to have your baby. This includes the building and the room. It could be the hospital's birthing room, a separate birthing center, or your own home.  Who you would like to assist with delivery of your baby. You may want your doctor, an obstetrician, or a certified nurse-midwife. Some women prefer a lay midwife or a doula to provide support before and after delivery.  Whether you want a doula, nurse, midwife, or childbirth educator to give you support from early labor until after childbirth.  What comfort measures you want. You may have to choose between walking around during labor or having the security of a heart monitor for your baby. You may want to listen to music during labor. You may know what position you want to be in (such as sitting, squatting, or reclining) for pushing.  What pain relief you would like. There are several choices, so be sure to talk with your doctor about them.  How you want you and your baby to spend the first few hours after birth.  You may want to keep your baby with you for at least 1 hour after birth for bonding and early breastfeeding.  You may want the hospital to delay some infant care steps, such as a vitamin K injection, so that you have calming time with your baby.  You may not want visitors, or you may want other family members there.  Know the early signs of labor, such as a steady   ache low in your back.  If you are going to a hospital or clinic to have your baby, have your bag ready to go.  Pack a nightgown, robe, panties, socks, and slippers. You may want to bring your own soap, shampoo, brush, toothbrush, and toothpaste. Bring your own  self-adhesive sanitary pads.  In your baby's bag, bring an outfit, small blanket, and diapers. Have your car seat ready to go.  When should you call for help?  Watch closely for changes in your health, and be sure to contact your doctor if you have any questions about preparing for childbirth.  Where can you learn more?  Go to https://www.healthwise.net/patientEd and enter H862 to learn more about "Preparing for Childbirth: Care Instructions."  Current as of: February 22, 2022               Content Version: 14.0   2006-2024 [employer].   Care instructions adapted under license by [facility]. If you have questions about a medical condition or this instruction, always ask your healthcare professional. [employer] disclaims any warranty or liability for your use of this information.       During Pregnancy: Exercises  Introduction  Here are some examples of exercises to do during your pregnancy. Start each exercise slowly. Ease off the exercise if you start to have pain.  Talk to your doctor about when you can start these exercises and which ones will work best for you.  How to do the exercises  Neck rotation    Sit up straight in a firm chair, or stand up straight. If you're standing, keep your feet about hip-width apart.  Keeping your chin level, turn your head to the right and hold for 15 to 30 seconds.  Turn your head to the left and hold for 15 to 30 seconds.  Repeat 2 to 4 times.  Neck stretch to the front    Sit up straight in a firm chair, or stand up straight. Look straight ahead. If you're standing, keep your feet about hip-width apart.  Slowly bend your head forward without moving your shoulders.  Hold for 15 to 30 seconds, then return to your starting position.  Repeat 2 to 4 times.  Back press    Stand with your back 10 to 12 inches away from a wall.  Lean into the wall until your back is against it. Press your lower back against the wall by pulling in your stomach muscles.  Slowly  slide down until your knees are slightly bent, pressing your lower back against the wall.  Hold for at least 6 seconds, then slide back up the wall.  Repeat 8 to 12 times.  Over time, work up to holding this position for as much as 1 minute.  Trunk twist    Sit on the floor with your legs crossed. If that's not comfortable, you can sit on a folded blanket so your bottom is a few inches off the floor. Or you can sit on a chair with your knees hip-width apart and your feet flat on the floor.  Reach your left hand toward your right knee. You can place your right hand at your side for support.  Slowly twist your body (trunk) to your right.  Relax and return to your starting position.  Repeat 2 to 4 times.  Switch your hands and twist to your left.  Repeat 2 to 4 times.  Pelvic rocking on hands and knees    Start on   your hands and knees. Place your wrists directly below your shoulders and your knees below your hips.  Breathe in slowly. Tuck your head downward and round your back up, making a curve with your back in the shape of the letter C. Hold this position for about 6 seconds.  Breathe out slowly and bring your head back up. Relax, keeping your back straight. (Don't allow it to curve toward the floor.) Hold for about 6 seconds.  Repeat 8 to 12 times, gently rocking your pelvis.  Pelvic tilt    Lie on your back with your knees bent and your feet flat on the floor.  Tighten your belly muscles by pulling your belly button in toward your spine. Press your lower back to the floor. You should feel your hips and pelvis rock back.  Hold for 6 seconds while breathing smoothly, and then relax.  Repeat 8 to 12 times.  Do this exercise only during the first 4 months of pregnancy. After this point, lying on your back is not recommended, because it can cause blood flow problems for you and your baby.  Backward stretch    Start on your hands and knees with your knees 8 to 10 inches apart, hands directly below your shoulders, and  arms and back straight.  Keeping your arms straight, slowly lower your buttocks toward your heels and tuck your head toward your knees. Hold for 15 to 30 seconds.  Slowly return to the starting position.  Repeat 2 to 4 times.  Forward bend    Sit comfortably in a chair, with your arms relaxed.  Slowly bend forward, allowing your arms to hang down. Lean only as far as you can without feeling discomfort or pressure on your belly.  Hold for 15 to 30 seconds and then slowly sit up straight.  Repeat 2 to 4 times or to your comfort level.  Donkey kick    Start on your hands and knees. Place your hands directly below your shoulders, and keep your arms straight.  Tighten your belly muscles by pulling your belly button in toward your spine. Keep breathing normally, and don't hold your breath.  Lift one knee and bring it toward your elbow.  Slowly extend that leg behind you without completely straightening it. Be careful not to let your hip drop down. Avoid arching your back.  Hold your leg behind you for about 6 seconds.  Return to your starting position.  Repeat 8 to 12 times for each leg.  Tailor sitting    Sit on the floor.  Bring your feet close to your body while crossing your ankles.  Keep your back straight. Relax your legs and let your knees drop toward the floor.  Hold this position for as long as you are comfortable.  Toe reach    Sit on the floor with your back straight, legs about 12 inches apart, and feet relaxed outward.  Stretch your hands forward toward your right foot, then sit up.  Stretch your hands straight forward, then sit up.  Stretch your hands forward toward your left foot, then sit up.  Hold each stretch for 15 to 30 seconds.  Repeat 2 to 4 times.  Follow-up care is a key part of your treatment and safety. Be sure to make and go to all appointments, and call your doctor if you are having problems. It's also a good idea to know your test results and keep a list of the medicines you take.  Current as    of: February 22, 2022               Content Version: 14.0   2006-2024 [employer], [employer].   Care instructions adapted under license by [facility]. If you have questions about a medical condition or this instruction, always ask your healthcare professional. [employer] disclaims any warranty or liability for your use of this information.       Group B Strep During Pregnancy: Care Instructions  Overview     Group B strep infection is caused by a type of bacteria. It's a different kind of bacteria than the kind that causes strep throat.  You may have this kind of bacteria in your body. Sometimes it may cause an infection, but most of the time it doesn't make you sick or cause symptoms. But if you pass the bacteria to your baby during the birth, it can cause serious health problems for your baby.  If you have this bacteria in your body, you will get antibiotics when you are in labor. Antibiotics help prevent problems for a newborn baby.  After birth, doctors will watch and may test your baby. If your baby tests positive for Group B strep, your baby will get antibiotics.  If you plan to breastfeed your baby, don't worry. It will be safe to breastfeed.  Follow-up care is a key part of your treatment and safety. Be sure to make and go to all appointments, and call your doctor if you are having problems. It's also a good idea to know your test results and keep a list of the medicines you take.  How can you care for yourself at home?  If your doctor has prescribed antibiotics, take them as directed. Do not stop taking them just because you feel better. You need to take the full course of antibiotics.  Tell your doctor if you are allergic to any antibiotic.  If you go into labor, or your water breaks, go to the hospital. Your doctor will give you antibiotics to help protect your baby from infection.  Tell the doctors and nurses if you have an allergy to penicillin.  Tell the doctors and nurses at the  hospital that you tested positive for group B strep.  When should you call for help?   Call your doctor now or seek immediate medical care if:    You have symptoms of a urinary tract infection. These may include:  Pain or burning when you urinate.  A frequent need to urinate without being able to pass much urine.  Pain in the flank, which is just below the rib cage and above the waist on either side of the back.  Blood in your urine.  A fever.     You think you are in labor or your water has broken.     You have pain in your belly or pelvis.   Watch closely for changes in your health, and be sure to contact your doctor if you have any problems.  Where can you learn more?  Go to https://www.healthwise.net/patientEd and enter M001 to learn more about "Group B Strep During Pregnancy: Care Instructions."  Current as of: January 25, 2022               Content Version: 14.0   2006-2024 [employer].   Care instructions adapted under license by [facility]. If you have questions about a medical condition or this instruction, always ask your healthcare professional. [employer] disclaims any warranty or liability   for your use of this information.       Back Pain During Pregnancy: Care Instructions  Overview     Back pain has many possible causes. It is often caused by problems with muscles and ligaments in your back. The extra weight during pregnancy can put stress on your back. Moving, lifting, standing, sitting, or sleeping in an awkward way also can strain your back. Back pain can also be a sign of labor. Although it may hurt a lot, back pain often improves on its own. Use good home treatment, and take care not to stress your back.  Follow-up care is a key part of your treatment and safety. Be sure to make and go to all appointments, and call your doctor if you are having problems. It's also a good idea to know your test results and keep a list of the medicines you take.  How can you care for  yourself at home?  Ask your doctor about taking acetaminophen (Tylenol) for pain. Do not take aspirin, ibuprofen (Advil, Motrin), or naproxen (Aleve).  Do not take two or more pain medicines at the same time unless the doctor told you to. Many pain medicines have acetaminophen, which is Tylenol. Too much acetaminophen (Tylenol) can be harmful.  Try heat or ice, whichever feels better. Apply it for 10 to 20 minutes at a time, several times a day. Put a thin cloth between the heat or ice and your skin. A warm bath or shower may also help.  Lie on your left side with your knees and hips bent and a pillow between your legs. This reduces stress on your back.  Try to avoid standing or sitting for too long or heavy lifting. If your job requires lots of standing, sitting, or heavy lifting, ask your employer if you can take short breaks or adjust your work activity. You can ask your doctor to write a note requesting these breaks or other adjustments.  Wear supportive, low-heeled shoes. Avoid flat or high-heeled shoes.  Try a belly support band. Supporting your belly can take the strain off your back.  Ask your doctor about how much exercise you can do. Regular exercise such as swimming, water aerobics, walking, or stretching can help with back pain.  Ask your doctor about exercises to stretch, strengthen, and relax your muscles. Your doctor may recommend physical therapy.  When should you call for help?   Call your doctor now or seek immediate medical care if:    You've been having regular contractions for an hour. This means that you've had at least 6 contractions within 1 hour, even after you change your position and drink fluids.     You have new numbness in your buttocks, genital or rectal areas, or legs.     You have a new loss of bowel or bladder control.     You have symptoms of a urinary tract infection. These may include:  Pain or burning when you urinate.  A frequent need to urinate without being able to pass much  urine.  Pain in the flank, which is just below the rib cage and above the waist on either side of the back.  Blood in your urine.   Watch closely for changes in your health, and be sure to contact your doctor if:    You do not get better as expected.   Where can you learn more?  Go to https://www.healthwise.net/patientEd and enter C696 to learn more about "Back Pain During Pregnancy:   Care Instructions."  Current as of: February 22, 2022               Content Version: 14.0   2006-2024 [employer].   Care instructions adapted under license by [facility]. If you have questions about a medical condition or this instruction, always ask your healthcare professional. [employer] disclaims any warranty or liability for your use of this information.

## 2023-03-16 NOTE — Telephone Encounter (Signed)
Formatting of this note might be different from the original.  Received ER referral for pt. Called pt made appt with her.   Electronically signed by Gasper Sells at 03/16/2023 10:58 AM CDT

## 2023-03-17 ENCOUNTER — Encounter

## 2023-03-18 ENCOUNTER — Inpatient Hospital Stay: Payer: BLUE CROSS/BLUE SHIELD | Attending: Women's Health

## 2023-03-18 LAB — CBC WITH AUTO DIFFERENTIAL
Basophils %: 0.3 % (ref 0.0–1.0)
Basophils Absolute: 0 10*3/uL (ref 0.00–0.20)
Eosinophils %: 0.9 % (ref 0.0–5.0)
Eosinophils Absolute: 0.1 10*3/uL (ref 0.00–0.60)
Hematocrit: 36.4 % — ABNORMAL LOW (ref 37.0–47.0)
Hemoglobin: 12.6 g/dL (ref 12.0–16.0)
Immature Granulocytes #: 0.1 10*3/uL
Lymphocytes %: 11.7 % — ABNORMAL LOW (ref 20.0–40.0)
Lymphocytes Absolute: 1.3 10*3/uL (ref 1.1–4.5)
MCH: 32.7 pg — ABNORMAL HIGH (ref 27.0–31.0)
MCHC: 34.6 g/dL (ref 33.0–37.0)
MCV: 94.5 fL (ref 81.0–99.0)
MPV: 10.9 fL (ref 9.4–12.3)
Monocytes %: 4.1 % (ref 0.0–10.0)
Monocytes Absolute: 0.5 10*3/uL (ref 0.00–0.90)
Neutrophils %: 82.3 % — ABNORMAL HIGH (ref 50.0–65.0)
Neutrophils Absolute: 9.3 10*3/uL — ABNORMAL HIGH (ref 1.5–7.5)
Platelets: 176 10*3/uL (ref 130–400)
RBC: 3.85 M/uL — ABNORMAL LOW (ref 4.20–5.40)
RDW: 12 % (ref 11.5–14.5)
WBC: 11.4 10*3/uL — ABNORMAL HIGH (ref 4.8–10.8)

## 2023-03-18 LAB — COMPREHENSIVE METABOLIC PANEL
ALT: 9 U/L (ref 5–33)
AST: 13 U/L (ref 5–32)
Albumin: 3.5 g/dL (ref 3.5–5.2)
Alkaline Phosphatase: 107 U/L — ABNORMAL HIGH (ref 35–104)
Anion Gap: 13 mmol/L (ref 7–19)
BUN: 10 mg/dL (ref 6–20)
CO2: 18 mmol/L — ABNORMAL LOW (ref 22–29)
Calcium: 9 mg/dL (ref 8.6–10.0)
Chloride: 102 mmol/L (ref 98–111)
Creatinine: 0.5 mg/dL (ref 0.5–0.9)
Est, Glom Filt Rate: >90 (ref >60)
Glucose: 121 mg/dL — ABNORMAL HIGH (ref 74–109)
Potassium: 3.9 mmol/L (ref 3.5–5.0)
Sodium: 133 mmol/L — ABNORMAL LOW (ref 136–145)
Total Bilirubin: 0.3 mg/dL (ref 0.2–1.2)
Total Protein: 6.6 g/dL (ref 6.6–8.7)

## 2023-03-18 MED ORDER — LACTATED RINGERS IV BOLUS
Freq: Once | INTRAVENOUS | Status: AC
Start: 2023-03-18 — End: 2023-03-18
  Administered 2023-03-18: 17:00:00 1000 mL via INTRAVENOUS

## 2023-03-18 MED ORDER — LACTATED RINGERS IV BOLUS
Freq: Once | INTRAVENOUS | Status: DC
Start: 2023-03-18 — End: 2023-03-18

## 2023-03-18 NOTE — Progress Notes (Signed)
Discharge instructions reviewed with pt. She verbalizes understanding. Pt ambulatory out of labor and delivery accompanied by significant other.

## 2023-03-18 NOTE — Progress Notes (Signed)
Pt ambulatory to labor and delivery 220 for complaints of nausea and slight dizziness. Reports she has been coming in weekly for fluids and should have a standing order. Denies vaginal bleeding or leaking of fluid. Positive fetal movement. EFM and toco placed.

## 2023-03-18 NOTE — Discharge Instructions (Signed)
LABOR AND DELIVERY - OBSERVATION DISCHARGE INSTRUCTIONS    TERM LABOR: RETURN TO HOSPITAL IF:  __There is a leaking or sudden gush of fluid from the vagina (note color, odor, time and amount of fluid)    __ You have bright red vaginal bleeding    __You begin to have regular contractions, occuring every 3-4 minutes, each contraction lasting 45-60 seconds for one hour. Time contractions from beginning of one to the beginning of the next. True contractions have a regular rate and become progressively closer. They are not changed by position change and are usually more uncomfortable when walking.    PRE-TERM LABOR  __Your gestational age is 16 weeks: term pregnancy starts at 37 weeks.  __Fill your prescription and take as directed.  __Bed rest (left lying position is best).  __Refrain from sexual intercourse until you see your physician again.  __No heavy lifting or strenuous activity.    RETURN TO HOSPITAL IF:  __Contractions occur 3-4 in any hour (every 10-15 minutes), maybe with or without pain.  __Rhythmic or constant menstrual-like cramps  __Rhythmic or constant lower, dull backache may radiate to the sides or front. Increase or change in vaginal discharge, may be watery, pink, red or brown-tinged.      SYMPTOMS THAT REQUIRE PROMPT REPORTING:  __Rapid gain in weight  __Persistent or severe headache  __Visual disturbances (spots, blurred)  __Nausea/vomiting, with or without upper abdominal pain.  __Increase or persistent swelling (face puffiness, hands, legs, ankles)  __Decreased urinary output  __Drowsiness listlessness                __Keep appointment to see your attending physician - as scheduled       NOTE: If you do not begin to feel better, or you have any questions, contact your physician or call [phone], or return to the hospital.

## 2023-03-23 ENCOUNTER — Inpatient Hospital Stay: Payer: BLUE CROSS/BLUE SHIELD | Attending: Women's Health

## 2023-03-23 ENCOUNTER — Encounter: Admit: 2023-03-23 | Discharge: 2023-03-23 | Payer: BLUE CROSS/BLUE SHIELD | Attending: Women's Health

## 2023-03-23 ENCOUNTER — Encounter: Admit: 2023-03-23 | Discharge: 2023-03-23 | Payer: BLUE CROSS/BLUE SHIELD

## 2023-03-23 DIAGNOSIS — Z3A34 34 weeks gestation of pregnancy: Secondary | ICD-10-CM

## 2023-03-23 LAB — CBC WITH AUTO DIFFERENTIAL
Basophils %: 0.3 % (ref 0.0–1.0)
Basophils Absolute: 0 10*3/uL (ref 0.00–0.20)
Eosinophils %: 1.2 % (ref 0.0–5.0)
Eosinophils Absolute: 0.2 10*3/uL (ref 0.00–0.60)
Hematocrit: 36 % — ABNORMAL LOW (ref 37.0–47.0)
Hemoglobin: 12.6 g/dL (ref 12.0–16.0)
Immature Granulocytes #: 0.1 10*3/uL
Lymphocytes %: 12.3 % — ABNORMAL LOW (ref 20.0–40.0)
Lymphocytes Absolute: 1.5 10*3/uL (ref 1.1–4.5)
MCH: 33.2 pg — ABNORMAL HIGH (ref 27.0–31.0)
MCHC: 35 g/dL (ref 33.0–37.0)
MCV: 95 fL (ref 81.0–99.0)
MPV: 11 fL (ref 9.4–12.3)
Monocytes %: 5.2 % (ref 0.0–10.0)
Monocytes Absolute: 0.6 10*3/uL (ref 0.00–0.90)
Neutrophils %: 80.4 % — ABNORMAL HIGH (ref 50.0–65.0)
Neutrophils Absolute: 9.9 10*3/uL — ABNORMAL HIGH (ref 1.5–7.5)
Platelets: 178 10*3/uL (ref 130–400)
RBC: 3.79 M/uL — ABNORMAL LOW (ref 4.20–5.40)
RDW: 12 % (ref 11.5–14.5)
WBC: 12.3 10*3/uL — ABNORMAL HIGH (ref 4.8–10.8)

## 2023-03-23 LAB — COMPREHENSIVE METABOLIC PANEL
ALT: 7 U/L (ref 5–33)
AST: 13 U/L (ref 5–32)
Albumin: 3.5 g/dL (ref 3.5–5.2)
Alkaline Phosphatase: 135 U/L — ABNORMAL HIGH (ref 35–104)
Anion Gap: 13 mmol/L (ref 7–19)
BUN: 7 mg/dL (ref 6–20)
CO2: 21 mmol/L — ABNORMAL LOW (ref 22–29)
Calcium: 8.7 mg/dL (ref 8.6–10.0)
Chloride: 102 mmol/L (ref 98–111)
Creatinine: 0.5 mg/dL (ref 0.5–0.9)
Est, Glom Filt Rate: >90 (ref >60)
Glucose: 85 mg/dL (ref 74–109)
Potassium: 4.5 mmol/L (ref 3.5–5.0)
Sodium: 136 mmol/L (ref 136–145)
Total Bilirubin: 0.3 mg/dL (ref 0.2–1.2)
Total Protein: 6.8 g/dL (ref 6.6–8.7)

## 2023-03-23 MED ORDER — LACTATED RINGERS IV BOLUS
Freq: Once | INTRAVENOUS | Status: AC
Start: 2023-03-23 — End: 2023-03-23
  Administered 2023-03-23: 18:00:00 1000 mL via INTRAVENOUS

## 2023-03-23 MED ORDER — VITAMIN K1 1 MG/0.5ML IJ SOLN
1 | INTRAMUSCULAR | Status: AC
Start: 2023-03-23 — End: ?

## 2023-03-23 MED ORDER — ONDANSETRON HCL 4 MG PO TABS
4 MG | ORAL_TABLET | Freq: Every day | ORAL | 0 refills | Status: AC | PRN
Start: 2023-03-23 — End: 2023-04-08

## 2023-03-23 MED ORDER — ONDANSETRON HCL 4 MG/2ML IJ SOLN
4 | Freq: Once | INTRAMUSCULAR | Status: AC
Start: 2023-03-23 — End: 2023-03-23
  Administered 2023-03-23: 17:00:00 4 mg via INTRAVENOUS

## 2023-03-23 MED ORDER — LACTATED RINGERS IV BOLUS
Freq: Once | INTRAVENOUS | Status: AC
Start: 2023-03-23 — End: 2023-03-23
  Administered 2023-03-23: 17:00:00 1000 mL via INTRAVENOUS

## 2023-03-23 MED FILL — VITAMIN K1 1 MG/0.5ML IJ SOLN: 1 MG/0.5ML | INTRAMUSCULAR | Qty: 0.5

## 2023-03-23 MED FILL — ONDANSETRON HCL 4 MG/2ML IJ SOLN: 4 MG/2ML | INTRAMUSCULAR | Qty: 2

## 2023-03-23 NOTE — Other (Signed)
Verbal discharge instructions given to patient. Patient verbalized understanding and denies questions at this time. Pt ambulatory from ob unit without difficulty.

## 2023-03-23 NOTE — Other (Signed)
Pt ambulated to L&D for her weekly fluids. Pt states she has been gagging a lot this week and vomiting 3-4 times per day. Pt reports +Fm. Pt denies lof,vaginal bleeding or ctx at this time. Orders in and will start iv soon.

## 2023-03-23 NOTE — Progress Notes (Signed)
CNM Prenatal Office Note  Subjective:  Krystal Bird is here for a return obstetrical visit. Today she is [redacted]w[redacted]d weeks EGA.  Pt does feel fetal movement regularly. Denies headaches, RUQ pain, or visual changes as well as contractions, vaginal bleeding or leaking of fluid.     Problems/complaints today:  Nausea  Objective:  Mother's Prenatal Vitals  BP: 124/71  Weight - Scale: 103.9 kg (229 lb)  Pulse: 72  Patient Position: Sitting  Prenatal Fetal Information  Fundal Height (cm): 34 cm  Fetal HR: 132  Movement: Present  Pt is A&Ox3, in no acute distress. Normocephalic, atraumatic. PERRL. Resp even and non-labored. Skin pink, warm & dry. Gravid abdomen. MAE's well. Gait steady.   Assessment:    IUP at [redacted]w[redacted]d wks      Diagnosis Orders   1. [redacted] weeks gestation of pregnancy  ondansetron (ZOFRAN) 4 MG tablet      2. Encounter for prenatal care of first pregnancy, third trimester  ondansetron (ZOFRAN) 4 MG tablet        Plan:  Problems/Complaints Management Plan:  To LDR for fluids today.  Routine OB Management Plan:  Pt counseled on balanced nutrition, adequate fluid intake, taking PNV daily, and exercise along with GHTN precautions, Kick count, and Preterm labor  Continue with routine prenatal care.  Antenatal surveillance not indicated  RTC in 1 wks for prenatal visit    MEDICATIONS:  Orders Placed This Encounter   Medications    ondansetron (ZOFRAN) 4 MG tablet     Sig: Take 1 tablet by mouth daily as needed for Nausea or Vomiting     Dispense:  30 tablet     Refill:  0     ORDERS:  No orders of the defined types were placed in this encounter.      More than 50% of this 20 min visit was education and counseling.      I Edison Simon, LPN, am scribing for and in the presence of Antony Salmon, [place]  03/23/23  11:42 AM CDT   I have seen and examined the patient independently.  I reviewed all laboratory and imaging studies that are relevant.  I have reviewed and made any appropriate changes to the HPI.    Electronically  signed by Antony Salmon, APRN - CNM on 03/23/23  at 6:07 PM CDT

## 2023-03-23 NOTE — Patient Instructions (Addendum)
Learning About When to Call Your Doctor During Pregnancy (After 20 Weeks)  Overview  It's common to have concerns about what might be a problem when you're pregnant. Most pregnancies don't have any serious problems. But it's still important to know when to call your doctor if you have certain symptoms or signs of labor.  These are general suggestions. Your doctor may give you some more information about when to call.  When to call your doctor (after 20 weeks)  Call 911  anytime you think you may need emergency care. For example, call if:  You have severe vaginal bleeding. This means you are soaking through a pad each hour for 2 or more hours.  You have sudden, severe pain in your belly.  You have chest pain, are short of breath, or cough up blood.  You passed out (lost consciousness).  You have a seizure.  You see or feel the umbilical cord.  You think you are about to deliver your baby and can't make it safely to the hospital or birthing center.  Call your doctor now or seek immediate medical care if:  You have vaginal bleeding.  You have belly pain.  You have a fever.  You are dizzy or lightheaded, or you feel like you may faint.  You have signs of a blood clot in your leg (called a deep vein thrombosis), such as:  Pain in the calf, back of the knee, thigh, or groin.  Swelling in your leg or groin.  A color change on the leg or groin. The skin may be reddish or purplish, depending on your usual skin color.  You have symptoms of preeclampsia, such as:  Sudden swelling of your face, hands, or feet.  New vision problems (such as dimness, blurring, or seeing spots).  A severe headache.  You have a sudden release of fluid from your vagina. (You think your water broke.)  You've been having regular contractions for an hour. This means that you've had at least 6 contractions within 1 hour, even after you change your position and drink fluids.  You notice that your baby has stopped moving or is moving less than  normal.  You have signs of heart failure, such as:  New or increased shortness of breath.  New or worse swelling in your legs, ankles, or feet.  Sudden weight gain, such as more than 2 to 3 pounds in a day or 5 pounds in a week.  Feeling so tired or weak that you cannot do your usual activities.  You have symptoms of a urinary tract infection. These may include:  Pain or burning when you urinate.  A frequent need to urinate without being able to pass much urine.  Pain in the flank, which is just below the rib cage and above the waist on either side of the back.  Blood in your urine.  Watch closely for changes in your health, and be sure to contact your doctor if:  You have vaginal discharge that smells bad.  You feel sad, anxious, or hopeless for more than a few days.  You have skin changes, such as a rash, itching, or a yellow color to your skin.  You have other concerns about your pregnancy.  If you have labor signs at 37 weeks or more  If you have signs of labor at 37 weeks or more, your doctor may tell you to call when your labor becomes more active. Symptoms of active labor include:  Contractions that are   regular.  Contractions that are less than 5 minutes apart.  Contractions that are hard to talk through.  Follow-up care is a key part of your treatment and safety. Be sure to make and go to all appointments, and call your doctor if you are having problems. It's also a good idea to know your test results and keep a list of the medicines you take.  Where can you learn more?  Go to https://www.healthwise.net/patientEd and enter N531 to learn more about "Learning About When to Call Your Doctor During Pregnancy (After 20 Weeks)."  Current as of: February 22, 2022               Content Version: 14.0   2006-2024 [employer], [employer].   Care instructions adapted under license by [facility]. If you have questions about a medical condition or this instruction, always ask your healthcare professional. Healthwise,  Incorpor[employer] disclaims any warranty or liability for your use of this information.       Braxton Hicks Contractions: Care Instructions  Your Care Instructions     Braxton Hicks contractions prepare your uterus for labor. Think of them as a "warm-up" exercise that your body does. You may begin to feel them between the 28th and 30th weeks of your pregnancy. But they start as early as the 20th week.  Braxton Hicks contractions usually occur more often during the ninth month. They may go away when you are active and return when you rest. These contractions are like mild contractions of true labor, but they occur less often. (You feel fewer than 8 in an hour.) They don't cause your cervix to open.  It may be hard for you to tell the difference between Braxton Hicks contractions and true labor, especially in your first pregnancy.  Follow-up care is a key part of your treatment and safety. Be sure to make and go to all appointments, and call your doctor if you are having problems. It's also a good idea to know your test results and keep a list of the medicines you take.  How can you care for yourself at home?  Try a warm bath to help relieve muscle tension and reduce pain.  Change positions every 30 minutes. Take breaks if you must sit for a long time. Get up and walk around.  Drink plenty of water.  Taking short walks may help you feel better.  Your doctor needs to check any contractions that are getting stronger or closer together.  Where can you learn more?  Go to https://www.healthwise.net/patientEd and enter Z402 to learn more about "Braxton Hicks Contractions: Care Instructions."  Current as of: February 22, 2022               Content Version: 14.0   2006-2024 [employer], [employer].   Care instructions adapted under license by [facility]. If you have questions about a medical condition or this instruction, always ask your healthcare professional. Healthwise, Inc[employer] disclaims any warranty or liability for your  use of this information.       Back Pain During Pregnancy: Care Instructions  Overview     Back pain has many possible causes. It is often caused by problems with muscles and ligaments in your back. The extra weight during pregnancy can put stress on your back. Moving, lifting, standing, sitting, or sleeping in an awkward way also can strain your back. Back pain can also be a sign of labor. Although it may hurt a lot, back pain often improves on its   own. Use good home treatment, and take care not to stress your back.  Follow-up care is a key part of your treatment and safety. Be sure to make and go to all appointments, and call your doctor if you are having problems. It's also a good idea to know your test results and keep a list of the medicines you take.  How can you care for yourself at home?  Ask your doctor about taking acetaminophen (Tylenol) for pain. Do not take aspirin, ibuprofen (Advil, Motrin), or naproxen (Aleve).  Do not take two or more pain medicines at the same time unless the doctor told you to. Many pain medicines have acetaminophen, which is Tylenol. Too much acetaminophen (Tylenol) can be harmful.  Try heat or ice, whichever feels better. Apply it for 10 to 20 minutes at a time, several times a day. Put a thin cloth between the heat or ice and your skin. A warm bath or shower may also help.  Lie on your left side with your knees and hips bent and a pillow between your legs. This reduces stress on your back.  Try to avoid standing or sitting for too long or heavy lifting. If your job requires lots of standing, sitting, or heavy lifting, ask your employer if you can take short breaks or adjust your work activity. You can ask your doctor to write a note requesting these breaks or other adjustments.  Wear supportive, low-heeled shoes. Avoid flat or high-heeled shoes.  Try a belly support band. Supporting your belly can take the strain off your back.  Ask your doctor about how much exercise you can  do. Regular exercise such as swimming, water aerobics, walking, or stretching can help with back pain.  Ask your doctor about exercises to stretch, strengthen, and relax your muscles. Your doctor may recommend physical therapy.  When should you call for help?   Call your doctor now or seek immediate medical care if:    You've been having regular contractions for an hour. This means that you've had at least 6 contractions within 1 hour, even after you change your position and drink fluids.     You have new numbness in your buttocks, genital or rectal areas, or legs.     You have a new loss of bowel or bladder control.     You have symptoms of a urinary tract infection. These may include:  Pain or burning when you urinate.  A frequent need to urinate without being able to pass much urine.  Pain in the flank, which is just below the rib cage and above the waist on either side of the back.  Blood in your urine.   Watch closely for changes in your health, and be sure to contact your doctor if:    You do not get better as expected.   Where can you learn more?  Go to https://www.healthwise.net/patientEd and enter C696 to learn more about "Back Pain During Pregnancy: Care Instructions."  Current as of: February 22, 2022               Content Version: 14.0   2006-2024 [employer], Inc[employer].   Care instructions adapted under license by [facility]. If you have questions about a medical condition or this instruction, always ask your healthcare professional. Healthwise, Incorporated disclaims any warranty or liability for your use of this information.        Signs of Labor (01:39)  Your health professional recommends that you watch this short   online health video.  Learn the signs of labor so you'll know what to do when you're ready to have your baby.  Purpose:  Outlines labor signs, including timing of contractions, Braxton Hicks, and having a plan to call the doctor.  Goal:  The user will understand the signs of labor and  learn what to do when labor starts.     How to watch the video    Scan the QR code   OR Visit the website    [email or web address].[email or web address]   Current as of: February 22, 2022               Content Version: 14.0   2006-2024 [employer].   Care instructions adapted under license by [facility]. If you have questions about a medical condition or this instruction, always ask your healthcare professional. [employer] disclaims any warranty or liability for your use of this information.       Pain Relief During Labor: Care Instructions  Overview     You can choose from a few types of pain relief for childbirth. These types include:  Medical. Your doctor or midwife may offer different types of pain medicine while you are in labor.  Nonmedical. This can include things like breathing techniques and massage.  You also can use more than one of these choices.  Think about what you want during labor. Your personal needs are important when you make this choice. The right choice is the one that feels right to you. Every labor is different. You may go into labor planning to use nonmedical options only and later find that you need pain medicine. For example, you will be given medicines if you need a cesarean (C-section). Plan for what you want. But be aware that things can change during labor.  Follow-up care is a key part of your treatment and safety. Be sure to make and go to all appointments, and call your doctor if you are having problems. It's also a good idea to know your test results and keep a list of the medicines you take.  What medical options can you use for pain relief?  Here are some medicines that you may be able to choose for pain control during childbirth.  Spinal and epidural pain relief. A doctor injects medicine into a space around the spinal cord. This is called an epidural. A spinal is an injection of medicine into the spinal fluid. It works faster than an epidural. In some cases, a  doctor combines a spinal with an epidural.  I.V. medicines. These are medicines given through a vein. They don't stop pain completely.  Nitrous oxide. You can give yourself nitrous oxide through a mask when you need pain relief.  Pudendal block. You get a shot of medicine to numb the area around the vaginal opening.  What are some examples?  Nonmedical pain relief includes:  Support. Having a doula or support person with you may help you manage your pain better.  Breathing techniques. Breathing in a rhythm can distract you from pain. Childbirth classes can teach you how to do focused breathing.  Distraction. You can walk, play cards, listen to music, watch TV, take a shower, or read. These can help take your mind off your contractions.  Massage. You can ask someone to massage your shoulders and lower back during contractions.  Changing positions. This can help you be more comfortable.  Imagery. You can imagine a peaceful place. For instance, you can think of contractions   as waves rolling over you.  Laboring in water. Soaking in warm water may help ease stress in early labor.  Acupuncture. This treatment during labor may help you manage pain.  When should you call for help?  Watch closely for changes in your health, and be sure to contact your doctor if:    You want to learn more about pain relief.   Where can you learn more?  Go to https://www.healthwise.net/patientEd and enter J372 to learn more about "Pain Relief During Labor: Care Instructions."  Current as of: February 22, 2022               Content Version: 14.0   2006-2024 [employer]wise, Incorporated.   Care instructions adapted under license by [facility]. If you have questions about a medical condition or this instruction, always ask your healthcare professional. Healthwise, Incorporated disclaims any warranty or liability for your use of this information.       Learning About Spinal and Epidural Pain Relief for Childbirth  What are spinal and epidural pain  relief?     Spinal and epidural pain relief methods are used to block pain from an entire region of the body. They use numbing medicine given near the spinal cord. They can be used for either a vaginal birth or a cesarean delivery (C-section). They partly or fully numb your belly and lower body.  How are spinal and epidural pain relief done?  You may need to sit up and curl your body forward to round your lower back. Or you'll lie on your side and curl your knees up to your chest. First you'll get a shot to numb the skin on your back. Then the doctor or nurse will put a needle into the numbed area.  For a spinal, you'll probably get a shot of numbing medicine near your spinal cord. For an epidural, usually a thin tube (catheter) is placed through a needle into the space next to the spinal cord. Then the needle is removed. The tube stays in your back to supply the numbing medicine. Sometimes a spinal and an epidural are combined.  An epidural catheter may be placed but not used unless it is needed. This is done in case you need one right away. An example of this is if you need a cesarean (C-section). Or it may be used if you decide you want it for pain relief. It's safer to place the catheter early in labor.  The medicine will fully or partly numb your belly and lower body. The amount and type of medicine you get will affect how numb you are. For labor and vaginal birth, a lower dose is often used to ease pain. But it usually will allow enough feeling and muscle strength so that you can push during contractions. For a C-section, a higher dose and stronger medicine is used to block your pain. You will still feel pressure.  You may need to stay in bed during labor. You will also have an I.V. placed in a vein. Plus, you will have a fetal monitor.  The specialist sets the amount of medicine you will get through an epidural tube. Sometimes you will be able to push a button for more medicine when you need it. This is  called patient-controlled epidural analgesia (PCEA).  What should you tell your doctor?  Tell your doctor about your health history. Let them know if you or a family member has had problems with anesthesia in the past. You can also talk   to the doctor about medical and nonmedical pain relief options for childbirth. Plan for what you want. But be aware that things can change during labor.  Depending on your health conditions, your doctor may want to have an epidural catheter placed early in labor. This would only be used if needed. For example, you may plan to use nonmedical pain relief but then decide later that you want medicines. Or the catheter would be used to give you anesthesia if you need a cesarean (C-section) for your or your baby's health and safety.  What are the risks of spinal and epidural pain relief for childbirth?  Serious problems aren't common. Some side effects may happen, such as a headache or nausea. The injection site may be sore. Your heart or breathing can be affected by the medicine. In rare cases, nerve damage can cause long-term numbness, weakness, or pain. Risks to the baby are rare.  Where can you learn more?  Go to https://www.healthwise.net/patientEd and enter C529 to learn more about "Learning About Spinal and Epidural Pain Relief for Childbirth."  Current as of: February 06, 2022               Content Version: 14.0   2006-2024 [employer], Inc[employer].   Care instructions adapted under license by [facility]. If you have questions about a medical condition or this instruction, always ask your healthcare professional. Healthwise, Incorporated disclaims any warranty or liability for your use of this information.                          Learning About When to Call Your Doctor During Pregnancy (After 20 Weeks)  Overview  It's common to have concerns about what might be a problem when you're pregnant. Most pregnancies don't have any serious problems. But it's still important to know when to  call your doctor if you have certain symptoms or signs of labor.  These are general suggestions. Your doctor may give you some more information about when to call.  When to call your doctor (after 20 weeks)  Call 911  anytime you think you may need emergency care. For example, call if:  You have severe vaginal bleeding. This means you are soaking through a pad each hour for 2 or more hours.  You have sudden, severe pain in your belly.  You have chest pain, are short of breath, or cough up blood.  You passed out (lost consciousness).  You have a seizure.  You see or feel the umbilical cord.  You think you are about to deliver your baby and can't make it safely to the hospital or birthing center.  Call your doctor now or seek immediate medical care if:  You have vaginal bleeding.  You have belly pain.  You have a fever.  You are dizzy or lightheaded, or you feel like you may faint.  You have signs of a blood clot in your leg (called a deep vein thrombosis), such as:  Pain in the calf, back of the knee, thigh, or groin.  Swelling in your leg or groin.  A color change on the leg or groin. The skin may be reddish or purplish, depending on your usual skin color.  You have symptoms of preeclampsia, such as:  Sudden swelling of your face, hands, or feet.  New vision problems (such as dimness, blurring, or seeing spots).  A severe headache.  You have a sudden release of fluid from your vagina. (You   think your water broke.)  You've been having regular contractions for an hour. This means that you've had at least 6 contractions within 1 hour, even after you change your position and drink fluids.  You notice that your baby has stopped moving or is moving less than normal.  You have signs of heart failure, such as:  New or increased shortness of breath.  New or worse swelling in your legs, ankles, or feet.  Sudden weight gain, such as more than 2 to 3 pounds in a day or 5 pounds in a week.  Feeling so tired or weak that you  cannot do your usual activities.  You have symptoms of a urinary tract infection. These may include:  Pain or burning when you urinate.  A frequent need to urinate without being able to pass much urine.  Pain in the flank, which is just below the rib cage and above the waist on either side of the back.  Blood in your urine.  Watch closely for changes in your health, and be sure to contact your doctor if:  You have vaginal discharge that smells bad.  You feel sad, anxious, or hopeless for more than a few days.  You have skin changes, such as a rash, itching, or a yellow color to your skin.  You have other concerns about your pregnancy.  If you have labor signs at 37 weeks or more  If you have signs of labor at 37 weeks or more, your doctor may tell you to call when your labor becomes more active. Symptoms of active labor include:  Contractions that are regular.  Contractions that are less than 5 minutes apart.  Contractions that are hard to talk through.  Follow-up care is a key part of your treatment and safety. Be sure to make and go to all appointments, and call your doctor if you are having problems. It's also a good idea to know your test results and keep a list of the medicines you take.  Where can you learn more?  Go to https://www.healthwise.net/patientEd and enter N531 to learn more about "Learning About When to Call Your Doctor During Pregnancy (After 20 Weeks)."  Current as of: February 22, 2022               Content Version: 14.0   2006-2024 [employer]wise, Inc[employer].   Care instructions adapted under license by  [facility]. If you have questions about a medical condition or this instruction, always ask your healthcare professional. Healthwise[employer] Inc[employer] disclaims any warranty or liability for your use of this information.       Braxton Hicks Contractions: Care Instructions  Your Care Instructions     Braxton Hicks contractions prepare your uterus for labor. Think of them as a "warm-up" exercise that your  body does. You may begin to feel them between the 28th and 30th weeks of your pregnancy. But they start as early as the 20th week.  Braxton Hicks contractions usually occur more often during the ninth month. They may go away when you are active and return when you rest. These contractions are like mild contractions of true labor, but they occur less often. (You feel fewer than 8 in an hour.) They don't cause your cervix to open.  It may be hard for you to tell the difference between Braxton Hicks contractions and true labor, especially in your first pregnancy.  Follow-up care is a key part of your treatment and safety. Be sure to make and go to   all appointments, and call your doctor if you are having problems. It's also a good idea to know your test results and keep a list of the medicines you take.  How can you care for yourself at home?  Try a warm bath to help relieve muscle tension and reduce pain.  Change positions every 30 minutes. Take breaks if you must sit for a long time. Get up and walk around.  Drink plenty of water.  Taking short walks may help you feel better.  Your doctor needs to check any contractions that are getting stronger or closer together.  Where can you learn more?  Go to https://www.healthwise.net/patientEd and enter Z402 to learn more about "Braxton Hicks Contractions: Care Instructions."  Current as of: February 22, 2022               Content Version: 14.0   2006-2024 [employer], Inc[employer].   Care instructions adapted under license by [facility]. If you have questions about a medical condition or this instruction, always ask your healthcare professional. [employer] disclaims any warranty or liability for your use of this information.       Back Pain During Pregnancy: Care Instructions  Overview     Back pain has many possible causes. It is often caused by problems with muscles and ligaments in your back. The extra weight during pregnancy can put stress on your back.  Moving, lifting, standing, sitting, or sleeping in an awkward way also can strain your back. Back pain can also be a sign of labor. Although it may hurt a lot, back pain often improves on its own. Use good home treatment, and take care not to stress your back.  Follow-up care is a key part of your treatment and safety. Be sure to make and go to all appointments, and call your doctor if you are having problems. It's also a good idea to know your test results and keep a list of the medicines you take.  How can you care for yourself at home?  Ask your doctor about taking acetaminophen (Tylenol) for pain. Do not take aspirin, ibuprofen (Advil, Motrin), or naproxen (Aleve).  Do not take two or more pain medicines at the same time unless the doctor told you to. Many pain medicines have acetaminophen, which is Tylenol. Too much acetaminophen (Tylenol) can be harmful.  Try heat or ice, whichever feels better. Apply it for 10 to 20 minutes at a time, several times a day. Put a thin cloth between the heat or ice and your skin. A warm bath or shower may also help.  Lie on your left side with your knees and hips bent and a pillow between your legs. This reduces stress on your back.  Try to avoid standing or sitting for too long or heavy lifting. If your job requires lots of standing, sitting, or heavy lifting, ask your employer if you can take short breaks or adjust your work activity. You can ask your doctor to write a note requesting these breaks or other adjustments.  Wear supportive, low-heeled shoes. Avoid flat or high-heeled shoes.  Try a belly support band. Supporting your belly can take the strain off your back.  Ask your doctor about how much exercise you can do. Regular exercise such as swimming, water aerobics, walking, or stretching can help with back pain.  Ask your doctor about exercises to stretch, strengthen, and relax your muscles. Your doctor may recommend physical therapy.  When should you call for help?      Call your doctor now or seek immediate medical care if:    You've been having regular contractions for an hour. This means that you've had at least 6 contractions within 1 hour, even after you change your position and drink fluids.     You have new numbness in your buttocks, genital or rectal areas, or legs.     You have a new loss of bowel or bladder control.     You have symptoms of a urinary tract infection. These may include:  Pain or burning when you urinate.  A frequent need to urinate without being able to pass much urine.  Pain in the flank, which is just below the rib cage and above the waist on either side of the back.  Blood in your urine.   Watch closely for changes in your health, and be sure to contact your doctor if:    You do not get better as expected.   Where can you learn more?  Go to https://www.healthwise.net/patientEd and enter C696 to learn more about "Back Pain During Pregnancy: Care Instructions."  Current as of: February 22, 2022               Content Version: 14.0   2006-2024 [employer].   Care instructions adapted under license by [facility]. If you have questions about a medical condition or this instruction, always ask your healthcare professional. [employer], [employer] disclaims any warranty or liability for your use of this information.        Signs of Labor (01:39)  Your health professional recommends that you watch this short online health video.  Learn the signs of labor so you'll know what to do when you're ready to have your baby.  Purpose:  Outlines labor signs, including timing of contractions, Braxton Hicks, and having a plan to call the doctor.  Goal:  The user will understand the signs of labor and learn what to do when labor starts.     How to watch the video    Scan the QR code   OR Visit the website    https://hwi.se/r/[email or web address]   Current as of: February 22, 2022               Content Version: 14.0   2006-2024 [employer].   Care instructions  adapted under license by [facility]. If you have questions about a medical condition or this instruction, always ask your healthcare professional. [employer] disclaims any warranty or liability for your use of this information.       Pain Relief During Labor: Care Instructions  Overview     You can choose from a few types of pain relief for childbirth. These types include:  Medical. Your doctor or midwife may offer different types of pain medicine while you are in labor.  Nonmedical. This can include things like breathing techniques and massage.  You also can use more than one of these choices.  Think about what you want during labor. Your personal needs are important when you make this choice. The right choice is the one that feels right to you. Every labor is different. You may go into labor planning to use nonmedical options only and later find that you need pain medicine. For example, you will be given medicines if you need a cesarean (C-section). Plan for what you want. But be aware that things can change during labor.  Follow-up care is a key part of your treatment and safety. Be sure to make   and go to all appointments, and call your doctor if you are having problems. It's also a good idea to know your test results and keep a list of the medicines you take.  What medical options can you use for pain relief?  Here are some medicines that you may be able to choose for pain control during childbirth.  Spinal and epidural pain relief. A doctor injects medicine into a space around the spinal cord. This is called an epidural. A spinal is an injection of medicine into the spinal fluid. It works faster than an epidural. In some cases, a doctor combines a spinal with an epidural.  I.V. medicines. These are medicines given through a vein. They don't stop pain completely.  Nitrous oxide. You can give yourself nitrous oxide through a mask when you need pain relief.  Pudendal block. You get a shot of  medicine to numb the area around the vaginal opening.  What are some examples?  Nonmedical pain relief includes:  Support. Having a doula or support person with you may help you manage your pain better.  Breathing techniques. Breathing in a rhythm can distract you from pain. Childbirth classes can teach you how to do focused breathing.  Distraction. You can walk, play cards, listen to music, watch TV, take a shower, or read. These can help take your mind off your contractions.  Massage. You can ask someone to massage your shoulders and lower back during contractions.  Changing positions. This can help you be more comfortable.  Imagery. You can imagine a peaceful place. For instance, you can think of contractions as waves rolling over you.  Laboring in water. Soaking in warm water may help ease stress in early labor.  Acupuncture. This treatment during labor may help you manage pain.  When should you call for help?  Watch closely for changes in your health, and be sure to contact your doctor if:    You want to learn more about pain relief.   Where can you learn more?  Go to https://www.healthwise.net/patientEd and enter J372 to learn more about "Pain Relief During Labor: Care Instructions."  Current as of: February 22, 2022               Content Version: 14.0   2006-2024 [employer].   Care instructions adapted under license by [facility]. If you have questions about a medical condition or this instruction, always ask your healthcare professional. [employer] disclaims any warranty or liability for your use of this information.       Learning About Spinal and Epidural Pain Relief for Childbirth  What are spinal and epidural pain relief?     Spinal and epidural pain relief methods are used to block pain from an entire region of the body. They use numbing medicine given near the spinal cord. They can be used for either a vaginal birth or a cesarean delivery (C-section). They partly or fully  numb your belly and lower body.  How are spinal and epidural pain relief done?  You may need to sit up and curl your body forward to round your lower back. Or you'll lie on your side and curl your knees up to your chest. First you'll get a shot to numb the skin on your back. Then the doctor or nurse will put a needle into the numbed area.  For a spinal, you'll probably get a shot of numbing medicine near your spinal cord. For an epidural, usually a thin tube (catheter)   is placed through a needle into the space next to the spinal cord. Then the needle is removed. The tube stays in your back to supply the numbing medicine. Sometimes a spinal and an epidural are combined.  An epidural catheter may be placed but not used unless it is needed. This is done in case you need one right away. An example of this is if you need a cesarean (C-section). Or it may be used if you decide you want it for pain relief. It's safer to place the catheter early in labor.  The medicine will fully or partly numb your belly and lower body. The amount and type of medicine you get will affect how numb you are. For labor and vaginal birth, a lower dose is often used to ease pain. But it usually will allow enough feeling and muscle strength so that you can push during contractions. For a C-section, a higher dose and stronger medicine is used to block your pain. You will still feel pressure.  You may need to stay in bed during labor. You will also have an I.V. placed in a vein. Plus, you will have a fetal monitor.  The specialist sets the amount of medicine you will get through an epidural tube. Sometimes you will be able to push a button for more medicine when you need it. This is called patient-controlled epidural analgesia (PCEA).  What should you tell your doctor?  Tell your doctor about your health history. Let them know if you or a family member has had problems with anesthesia in the past. You can also talk to the doctor about medical and  nonmedical pain relief options for childbirth. Plan for what you want. But be aware that things can change during labor.  Depending on your health conditions, your doctor may want to have an epidural catheter placed early in labor. This would only be used if needed. For example, you may plan to use nonmedical pain relief but then decide later that you want medicines. Or the catheter would be used to give you anesthesia if you need a cesarean (C-section) for your or your baby's health and safety.  What are the risks of spinal and epidural pain relief for childbirth?  Serious problems aren't common. Some side effects may happen, such as a headache or nausea. The injection site may be sore. Your heart or breathing can be affected by the medicine. In rare cases, nerve damage can cause long-term numbness, weakness, or pain. Risks to the baby are rare.  Where can you learn more?  Go to https://www.healthwise.net/patientEd and enter C529 to learn more about "Learning About Spinal and Epidural Pain Relief for Childbirth."  Current as of: February 06, 2022               Content Version: 14.0   2006-2024 [employer].   Care instructions adapted under license by [facility]. If you have questions about a medical condition or this instruction, always ask your healthcare professional. [employer] disclaims any warranty or liability for your use of this information.

## 2023-03-23 NOTE — Progress Notes (Signed)
Pt presents today for routine prenatal visit. Pt denies vaginal bleeding, cramping, or leaking of fluid +fetal movement.     Pt c/o increased nausea.

## 2023-03-24 NOTE — Telephone Encounter (Signed)
Received denial for hospital admission. Dr. Sheryle Spray was admitting physician. Called office and was told to fax denial over so it could be addressed. Denial faxed 03/24/23.

## 2023-03-29 NOTE — Nursing Note (Signed)
Formatting of this note might be different from the original.  RN called to update Dr. Marcha Dutton of [facility] and ctx pattern. Orders received to d/c patient unless she decides she would like fluids (ordered weekly by OB in [place]). Patient states she would like to received fluids prior to d/c. Dr. Marcha Dutton updated. IV and fluid bolus started.  Electronically signed by Kristine Royal, RN BSN at 03/29/2023  8:39 PM CDT

## 2023-03-29 NOTE — Nursing Note (Signed)
Formatting of this note might be different from the original.  Dr schneider notified of strip - change in baseline.  Orders for prolonged monitoring  Electronically signed by Earl Lagos, RN at 03/29/2023  5:40 PM CDT

## 2023-03-29 NOTE — Nursing Note (Signed)
Formatting of this note might be different from the original.  Patient finished 1 L fluid bolus and states cramping is much better. Patient agreeable to plan to d/c home. RN educated patient to come back to [facility] triage if she noticed decreased fetal movement, leakage of fluid, bleeding, or any signs of labor. Patient verbalized understanding and states she has a follow up with her OB next Thursday.  Electronically signed by Kristine Royal, RN BSN at 03/29/2023 10:09 PM CDT

## 2023-03-29 NOTE — Unmapped (Signed)
Formatting of this note might be different from the original.         OB TRIAGE NOTE    DATE OF SERVICE:  03/29/2023    MRN:  [id number]  PATIENT:  Krystal Bird     AGE:  27 y.o.                 ATTENDING: Luana Shu*     G1P0           [redacted]w[redacted]d    Estimated Date of Delivery: 05/01/23      Krystal Bird has a past medical history of Depression, GAD (generalized anxiety disorder) (11/01/2015), and Obesity. The patient  has a past surgical history that includes No past surgeries (11/06/2019).    CHIEF COMPLAINT:  had concerns including Decreased Fetal Movement.     VITAL SIGNS WITH RANGES: HR:  [60] 60  BP: (117)/(60) 117/60    EFM INTERPRETATION  Baseline Fetal Heart Rate (bpm): 110 bpm  Baseline Classification: Normal  Variability: Moderate (Between 6 and 25 BPM)  Accelerations: 15X15  Decelerations: None  FHR Category: Category I      CONTRACTION PATTERN  Contraction Frequency: none  Contraction Duration: 40-70  Contraction Quality: Mild  Resting Tone Palpated: Soft    CERVICAL EXAM  Dilation: Fingertip    Effacement (%): 30  Fetal Station: Ballotable       OB Examiner: c higgins rn    DIAGNOSTICS:           Labs Reviewed - No data to display    Diagnostic Imaging was collected.    ADDITIONAL COMMENTS: Patient received 1 L bolus IV fluids. Patient states cramping significantly improved. Cat I FHT reviewed    TRIAGE SUMMARY   The patient's condition was assessed and discussed with the provider, Cleophas Dunker . Discharge orders were received, and the patient was provided with D/C instructions. See signed AVS for more details.  On discharge the patient reported fetal movement. She was informed to follow up with her primary [facility] provider/[facility] per instructions. The patient was discharged.     Kristine Royal, RN BSN      Electronically signed by Kristine Royal, RN BSN at 03/29/2023  9:20 PM CDT

## 2023-03-29 NOTE — Nursing Note (Signed)
Formatting of this note might be different from the original.  Dr schneider notified of patient arrival - orders received   Electronically signed by Earl Lagos, RN at 03/29/2023  5:15 PM CDT

## 2023-03-31 NOTE — Patient Instructions (Incomplete)
Learning About When to Call Your Doctor During Pregnancy (After 20 Weeks)  Overview  It's common to have concerns about what might be a problem when you're pregnant. Most pregnancies don't have any serious problems. But it's still important to know when to call your doctor if you have certain symptoms or signs of labor.  These are general suggestions. Your doctor may give you some more information about when to call.  When to call your doctor (after 20 weeks)  Call 911  anytime you think you may need emergency care. For example, call if:  You have severe vaginal bleeding. This means you are soaking through a pad each hour for 2 or more hours.  You have sudden, severe pain in your belly.  You have chest pain, are short of breath, or cough up blood.  You passed out (lost consciousness).  You have a seizure.  You see or feel the umbilical cord.  You think you are about to deliver your baby and can't make it safely to the hospital or birthing center.  Call your doctor now or seek immediate medical care if:  You have vaginal bleeding.  You have belly pain.  You have a fever.  You are dizzy or lightheaded, or you feel like you may faint.  You have signs of a blood clot in your leg (called a deep vein thrombosis), such as:  Pain in the calf, back of the knee, thigh, or groin.  Swelling in your leg or groin.  A color change on the leg or groin. The skin may be reddish or purplish, depending on your usual skin color.  You have symptoms of preeclampsia, such as:  Sudden swelling of your face, hands, or feet.  New vision problems (such as dimness, blurring, or seeing spots).  A severe headache.  You have a sudden release of fluid from your vagina. (You think your water broke.)  You've been having regular contractions for an hour. This means that you've had at least 6 contractions within 1 hour, even after you change your position and drink fluids.  You notice that your baby has stopped moving or is moving less than  normal.  You have signs of heart failure, such as:  New or increased shortness of breath.  New or worse swelling in your legs, ankles, or feet.  Sudden weight gain, such as more than 2 to 3 pounds in a day or 5 pounds in a week.  Feeling so tired or weak that you cannot do your usual activities.  You have symptoms of a urinary tract infection. These may include:  Pain or burning when you urinate.  A frequent need to urinate without being able to pass much urine.  Pain in the flank, which is just below the rib cage and above the waist on either side of the back.  Blood in your urine.  Watch closely for changes in your health, and be sure to contact your doctor if:  You have vaginal discharge that smells bad.  You feel sad, anxious, or hopeless for more than a few days.  You have skin changes, such as a rash, itching, or a yellow color to your skin.  You have other concerns about your pregnancy.  If you have labor signs at 37 weeks or more  If you have signs of labor at 37 weeks or more, your doctor may tell you to call when your labor becomes more active. Symptoms of active labor include:  Contractions that are   regular.  Contractions that are less than 5 minutes apart.  Contractions that are hard to talk through.  Follow-up care is a key part of your treatment and safety. Be sure to make and go to all appointments, and call your doctor if you are having problems. It's also a good idea to know your test results and keep a list of the medicines you take.  Where can you learn more?  Go to https://www.healthwise.net/patientEd and enter N531 to learn more about "Learning About When to Call Your Doctor During Pregnancy (After 20 Weeks)."  Current as of: February 22, 2022               Content Version: 14.0   2006-2024 [employer], [employer].   Care instructions adapted under license by [facility]. If you have questions about a medical condition or this instruction, always ask your healthcare professional. Healthwise,  Incorpor[employer] disclaims any warranty or liability for your use of this information.       Braxton Hicks Contractions: Care Instructions  Your Care Instructions     Braxton Hicks contractions prepare your uterus for labor. Think of them as a "warm-up" exercise that your body does. You may begin to feel them between the 28th and 30th weeks of your pregnancy. But they start as early as the 20th week.  Braxton Hicks contractions usually occur more often during the ninth month. They may go away when you are active and return when you rest. These contractions are like mild contractions of true labor, but they occur less often. (You feel fewer than 8 in an hour.) They don't cause your cervix to open.  It may be hard for you to tell the difference between Braxton Hicks contractions and true labor, especially in your first pregnancy.  Follow-up care is a key part of your treatment and safety. Be sure to make and go to all appointments, and call your doctor if you are having problems. It's also a good idea to know your test results and keep a list of the medicines you take.  How can you care for yourself at home?  Try a warm bath to help relieve muscle tension and reduce pain.  Change positions every 30 minutes. Take breaks if you must sit for a long time. Get up and walk around.  Drink plenty of water.  Taking short walks may help you feel better.  Your doctor needs to check any contractions that are getting stronger or closer together.  Where can you learn more?  Go to https://www.healthwise.net/patientEd and enter Z402 to learn more about "Braxton Hicks Contractions: Care Instructions."  Current as of: February 22, 2022               Content Version: 14.0   2006-2024 [employer], [employer].   Care instructions adapted under license by [facility]. If you have questions about a medical condition or this instruction, always ask your healthcare professional. Healthwise, Inc[employer] disclaims any warranty or liability for your  use of this information.       Back Pain During Pregnancy: Care Instructions  Overview     Back pain has many possible causes. It is often caused by problems with muscles and ligaments in your back. The extra weight during pregnancy can put stress on your back. Moving, lifting, standing, sitting, or sleeping in an awkward way also can strain your back. Back pain can also be a sign of labor. Although it may hurt a lot, back pain often improves on its   own. Use good home treatment, and take care not to stress your back.  Follow-up care is a key part of your treatment and safety. Be sure to make and go to all appointments, and call your doctor if you are having problems. It's also a good idea to know your test results and keep a list of the medicines you take.  How can you care for yourself at home?  Ask your doctor about taking acetaminophen (Tylenol) for pain. Do not take aspirin, ibuprofen (Advil, Motrin), or naproxen (Aleve).  Do not take two or more pain medicines at the same time unless the doctor told you to. Many pain medicines have acetaminophen, which is Tylenol. Too much acetaminophen (Tylenol) can be harmful.  Try heat or ice, whichever feels better. Apply it for 10 to 20 minutes at a time, several times a day. Put a thin cloth between the heat or ice and your skin. A warm bath or shower may also help.  Lie on your left side with your knees and hips bent and a pillow between your legs. This reduces stress on your back.  Try to avoid standing or sitting for too long or heavy lifting. If your job requires lots of standing, sitting, or heavy lifting, ask your employer if you can take short breaks or adjust your work activity. You can ask your doctor to write a note requesting these breaks or other adjustments.  Wear supportive, low-heeled shoes. Avoid flat or high-heeled shoes.  Try a belly support band. Supporting your belly can take the strain off your back.  Ask your doctor about how much exercise you can  do. Regular exercise such as swimming, water aerobics, walking, or stretching can help with back pain.  Ask your doctor about exercises to stretch, strengthen, and relax your muscles. Your doctor may recommend physical therapy.  When should you call for help?   Call your doctor now or seek immediate medical care if:    You've been having regular contractions for an hour. This means that you've had at least 6 contractions within 1 hour, even after you change your position and drink fluids.     You have new numbness in your buttocks, genital or rectal areas, or legs.     You have a new loss of bowel or bladder control.     You have symptoms of a urinary tract infection. These may include:  Pain or burning when you urinate.  A frequent need to urinate without being able to pass much urine.  Pain in the flank, which is just below the rib cage and above the waist on either side of the back.  Blood in your urine.   Watch closely for changes in your health, and be sure to contact your doctor if:    You do not get better as expected.   Where can you learn more?  Go to https://www.healthwise.net/patientEd and enter C696 to learn more about "Back Pain During Pregnancy: Care Instructions."  Current as of: February 22, 2022               Content Version: 14.0   2006-2024 [employer], [employer].   Care instructions adapted under license by [facility]. If you have questions about a medical condition or this instruction, always ask your healthcare professional. Healthwise, Incorporated disclaims any warranty or liability for your use of this information.        Signs of Labor (01:39)  Your health professional recommends that you watch this short   online health video.  Learn the signs of labor so you'll know what to do when you're ready to have your baby.  Purpose:  Outlines labor signs, including timing of contractions, Braxton Hicks, and having a plan to call the doctor.  Goal:  The user will understand the signs of labor and  learn what to do when labor starts.     How to watch the video    Scan the QR code   OR Visit the website    https://hwi.se/[email or web address]   Current as of: February 22, 2022               Content Version: 14.0   2006-2024 [employer], [employer].   Care instructions adapted under license by [facility]. If you have questions about a medical condition or this instruction, always ask your healthcare professional. [employer], [employer] disclaims any warranty or liability for your use of this information.       Pain Relief During Labor: Care Instructions  Overview     You can choose from a few types of pain relief for childbirth. These types include:  Medical. Your doctor or midwife may offer different types of pain medicine while you are in labor.  Nonmedical. This can include things like breathing techniques and massage.  You also can use more than one of these choices.  Think about what you want during labor. Your personal needs are important when you make this choice. The right choice is the one that feels right to you. Every labor is different. You may go into labor planning to use nonmedical options only and later find that you need pain medicine. For example, you will be given medicines if you need a cesarean (C-section). Plan for what you want. But be aware that things can change during labor.  Follow-up care is a key part of your treatment and safety. Be sure to make and go to all appointments, and call your doctor if you are having problems. It's also a good idea to know your test results and keep a list of the medicines you take.  What medical options can you use for pain relief?  Here are some medicines that you may be able to choose for pain control during childbirth.  Spinal and epidural pain relief. A doctor injects medicine into a space around the spinal cord. This is called an epidural. A spinal is an injection of medicine into the spinal fluid. It works faster than an epidural. In some cases, a  doctor combines a spinal with an epidural.  I.V. medicines. These are medicines given through a vein. They don't stop pain completely.  Nitrous oxide. You can give yourself nitrous oxide through a mask when you need pain relief.  Pudendal block. You get a shot of medicine to numb the area around the vaginal opening.  What are some examples?  Nonmedical pain relief includes:  Support. Having a doula or support person with you may help you manage your pain better.  Breathing techniques. Breathing in a rhythm can distract you from pain. Childbirth classes can teach you how to do focused breathing.  Distraction. You can walk, play cards, listen to music, watch TV, take a shower, or read. These can help take your mind off your contractions.  Massage. You can ask someone to massage your shoulders and lower back during contractions.  Changing positions. This can help you be more comfortable.  Imagery. You can imagine a peaceful place. For instance, you can think of contractions   as waves rolling over you.  Laboring in water. Soaking in warm water may help ease stress in early labor.  Acupuncture. This treatment during labor may help you manage pain.  When should you call for help?  Watch closely for changes in your health, and be sure to contact your doctor if:    You want to learn more about pain relief.   Where can you learn more?  Go to https://www.healthwise.net/patientEd and enter J372 to learn more about "Pain Relief During Labor: Care Instructions."  Current as of: February 22, 2022               Content Version: 14.0   2006-2024 [employer], Inc[employer].   Care instructions adapted under license by [facility]. If you have questions about a medical condition or this instruction, always ask your healthcare professional. Healthwise, Incorporated disclaims any warranty or liability for your use of this information.       Learning About Spinal and Epidural Pain Relief for Childbirth  What are spinal and epidural pain  relief?     Spinal and epidural pain relief methods are used to block pain from an entire region of the body. They use numbing medicine given near the spinal cord. They can be used for either a vaginal birth or a cesarean delivery (C-section). They partly or fully numb your belly and lower body.  How are spinal and epidural pain relief done?  You may need to sit up and curl your body forward to round your lower back. Or you'll lie on your side and curl your knees up to your chest. First you'll get a shot to numb the skin on your back. Then the doctor or nurse will put a needle into the numbed area.  For a spinal, you'll probably get a shot of numbing medicine near your spinal cord. For an epidural, usually a thin tube (catheter) is placed through a needle into the space next to the spinal cord. Then the needle is removed. The tube stays in your back to supply the numbing medicine. Sometimes a spinal and an epidural are combined.  An epidural catheter may be placed but not used unless it is needed. This is done in case you need one right away. An example of this is if you need a cesarean (C-section). Or it may be used if you decide you want it for pain relief. It's safer to place the catheter early in labor.  The medicine will fully or partly numb your belly and lower body. The amount and type of medicine you get will affect how numb you are. For labor and vaginal birth, a lower dose is often used to ease pain. But it usually will allow enough feeling and muscle strength so that you can push during contractions. For a C-section, a higher dose and stronger medicine is used to block your pain. You will still feel pressure.  You may need to stay in bed during labor. You will also have an I.V. placed in a vein. Plus, you will have a fetal monitor.  The specialist sets the amount of medicine you will get through an epidural tube. Sometimes you will be able to push a button for more medicine when you need it. This is  called patient-controlled epidural analgesia (PCEA).  What should you tell your doctor?  Tell your doctor about your health history. Let them know if you or a family member has had problems with anesthesia in the past. You can also talk   to the doctor about medical and nonmedical pain relief options for childbirth. Plan for what you want. But be aware that things can change during labor.  Depending on your health conditions, your doctor may want to have an epidural catheter placed early in labor. This would only be used if needed. For example, you may plan to use nonmedical pain relief but then decide later that you want medicines. Or the catheter would be used to give you anesthesia if you need a cesarean (C-section) for your or your baby's health and safety.  What are the risks of spinal and epidural pain relief for childbirth?  Serious problems aren't common. Some side effects may happen, such as a headache or nausea. The injection site may be sore. Your heart or breathing can be affected by the medicine. In rare cases, nerve damage can cause long-term numbness, weakness, or pain. Risks to the baby are rare.  Where can you learn more?  Go to https://www.healthwise.net/patientEd and enter C529 to learn more about "Learning About Spinal and Epidural Pain Relief for Childbirth."  Current as of: February 06, 2022               Content Version: 14.0   2006-2024 [employer], [employer].   Care instructions adapted under license by [facility]. If you have questions about a medical condition or this instruction, always ask your healthcare professional. Health[employer], Inc[employer] disclaims any warranty or liability for your use of this information.

## 2023-04-05 ENCOUNTER — Ambulatory Visit: Admit: 2023-04-05 | Payer: BLUE CROSS/BLUE SHIELD

## 2023-04-05 ENCOUNTER — Inpatient Hospital Stay
Admit: 2023-04-05 | Discharge: 2023-04-08 | Disposition: A | Discharge status: Home / Self Care | Payer: BLUE CROSS/BLUE SHIELD | Attending: Obstetrics & Gynecology | Admitting: Obstetrics & Gynecology

## 2023-04-05 ENCOUNTER — Encounter: Admit: 2023-04-05 | Discharge: 2023-04-05 | Payer: BLUE CROSS/BLUE SHIELD

## 2023-04-05 ENCOUNTER — Encounter: Admit: 2023-04-05 | Discharge: 2023-04-05 | Payer: BLUE CROSS/BLUE SHIELD | Attending: Women's Health

## 2023-04-05 DIAGNOSIS — Z98891 History of uterine scar from previous surgery: Secondary | ICD-10-CM

## 2023-04-05 DIAGNOSIS — Z3A36 36 weeks gestation of pregnancy: Secondary | ICD-10-CM

## 2023-04-05 DIAGNOSIS — O99892 Other specified diseases and conditions complicating childbirth: Secondary | ICD-10-CM

## 2023-04-05 LAB — CHLAMYDIA, GONORRHEA, TRICHOMONIASIS
C. trachomatis DNA: NOT DETECTED
N. gonorrhoeae DNA: NOT DETECTED
Trichomonas Vaginalis DNA: NOT DETECTED

## 2023-04-05 LAB — POCT VENOUS
Base Excess, Ven: -4
CO2: 20 meq/L — ABNORMAL LOW (ref 21–32)
Calcium, Ionized: 1.53 mmol/L — ABNORMAL HIGH (ref 1.10–1.30)
Est, Glom Filt Rate: >90 (ref >60)
Hemoglobin: 11.7 g/dL — ABNORMAL LOW (ref 12.0–18.0)
O2 Sat, Ven: 56 %
PO2, Ven: 30.3 mmHg
POC Anion Gap: 10
POC Chloride: 107 meq/L (ref 99–110)
POC Creatinine: 0.6 mg/dL (ref 0.3–1.3)
POC Glucose: 104 mg/dL — ABNORMAL HIGH (ref 70–99)
POC Hematocrit: 34 % — ABNORMAL LOW (ref 37–52)
POC Potassium: 4.9 meq/L (ref 3.5–5.1)
POC Sodium: 137 meq/L (ref 136–145)
TC02 (Calc), Ven: 22 mmol/L
pCO2, Ven: 36.1 mmHg — ABNORMAL LOW (ref 40.0–50.0)
pH, Ven: 7.361

## 2023-04-05 LAB — CBC WITH AUTO DIFFERENTIAL
Basophils %: 0.4 % (ref 0.0–1.0)
Basophils Absolute: 0 10*3/uL (ref 0.00–0.20)
Eosinophils %: 1.3 % (ref 0.0–5.0)
Eosinophils Absolute: 0.1 10*3/uL (ref 0.00–0.60)
Hematocrit: 35.1 % — ABNORMAL LOW (ref 37.0–47.0)
Hemoglobin: 12.3 g/dL (ref 12.0–16.0)
Immature Granulocytes #: 0 10*3/uL
Lymphocytes %: 14.7 % — ABNORMAL LOW (ref 20.0–40.0)
Lymphocytes Absolute: 1.5 10*3/uL (ref 1.1–4.5)
MCH: 32.9 pg — ABNORMAL HIGH (ref 27.0–31.0)
MCHC: 35 g/dL (ref 33.0–37.0)
MCV: 93.9 fL (ref 81.0–99.0)
MPV: 11 fL (ref 9.4–12.3)
Monocytes %: 6.7 % (ref 0.0–10.0)
Monocytes Absolute: 0.7 10*3/uL (ref 0.00–0.90)
Neutrophils %: 76.5 % — ABNORMAL HIGH (ref 50.0–65.0)
Neutrophils Absolute: 7.9 10*3/uL — ABNORMAL HIGH (ref 1.5–7.5)
Platelets: 174 10*3/uL (ref 130–400)
RBC: 3.74 M/uL — ABNORMAL LOW (ref 4.20–5.40)
RDW: 12.1 % (ref 11.5–14.5)
WBC: 10.4 10*3/uL (ref 4.8–10.8)

## 2023-04-05 LAB — COMPREHENSIVE METABOLIC PANEL
ALT: 9 U/L (ref 5–33)
AST: 15 U/L (ref 5–32)
Albumin: 3.3 g/dL — ABNORMAL LOW (ref 3.5–5.2)
Alkaline Phosphatase: 120 U/L — ABNORMAL HIGH (ref 35–104)
Anion Gap: 15 mmol/L (ref 7–19)
BUN: 6 mg/dL (ref 6–20)
CO2: 18 mmol/L — ABNORMAL LOW (ref 22–29)
Calcium: 8.4 mg/dL — ABNORMAL LOW (ref 8.6–10.0)
Chloride: 104 mmol/L (ref 98–111)
Creatinine: 0.4 mg/dL — ABNORMAL LOW (ref 0.5–0.9)
Est, Glom Filt Rate: >90 (ref >60)
Glucose: 76 mg/dL (ref 74–109)
Potassium: 4 mmol/L (ref 3.5–5.0)
Sodium: 137 mmol/L (ref 136–145)
Total Bilirubin: 0.2 mg/dL (ref 0.2–1.2)
Total Protein: 6.4 g/dL — ABNORMAL LOW (ref 6.6–8.7)

## 2023-04-05 LAB — TYPE AND SCREEN
ABO/Rh: A POS
Antibody Screen: NEGATIVE

## 2023-04-05 MED ORDER — FAMOTIDINE (PF) 20 MG/2ML IV SOLN
20 | INTRAVENOUS | Status: AC
  Administered 2023-04-05: 20:00:00 20 mg via INTRAVENOUS

## 2023-04-05 MED ORDER — ACETAMINOPHEN 500 MG PO TABS
500 | Freq: Three times a day (TID) | ORAL | Status: DC
Start: 2023-04-05 — End: 2023-04-08
  Administered 2023-04-06 – 2023-04-08 (×7): 1000 mg via ORAL

## 2023-04-05 MED ORDER — ERYTHROMYCIN 5 MG/GM OP OINT
5 | OPHTHALMIC | Status: AC
Start: 2023-04-05 — End: ?

## 2023-04-05 MED ORDER — TETANUS-DIPHTH-ACELL PERTUSSIS 5-2.5-18.5 LF-MCG/0.5 IM SUSP
INTRAMUSCULAR | Status: DC
Start: 2023-04-05 — End: 2023-04-08

## 2023-04-05 MED ORDER — SODIUM CHLORIDE 0.9 % IV SOLN
0.9 | INTRAVENOUS | Status: DC | PRN
Start: 2023-04-05 — End: 2023-04-06

## 2023-04-05 MED ORDER — NORMAL SALINE FLUSH 0.9 % IV SOLN
0.9 | Freq: Two times a day (BID) | INTRAVENOUS | Status: DC
Start: 2023-04-05 — End: 2023-04-06

## 2023-04-05 MED ORDER — SERTRALINE HCL 50 MG PO TABS
50 | Freq: Two times a day (BID) | ORAL | Status: DC
Start: 2023-04-05 — End: 2023-04-08
  Administered 2023-04-06 – 2023-04-08 (×6): 50 mg via ORAL

## 2023-04-05 MED ORDER — MEASLES, MUMPS & RUBELLA VAC IJ SOLR
INTRAMUSCULAR | Status: DC
Start: 2023-04-05 — End: 2023-04-08

## 2023-04-05 MED ORDER — SODIUM CHLORIDE (PF) 0.9 % IJ SOLN: 0.9 | INTRAMUSCULAR | Status: DC | PRN

## 2023-04-05 MED ORDER — NORMAL SALINE FLUSH 0.9 % IV SOLN
0.9 | Freq: Two times a day (BID) | INTRAVENOUS | Status: DC
Start: 2023-04-05 — End: 2023-04-08
  Administered 2023-04-06: 10 mL via INTRAVENOUS

## 2023-04-05 MED ORDER — VITAMIN K1 1 MG/0.5ML IJ SOLN
1 | INTRAMUSCULAR | Status: AC
Start: 2023-04-05 — End: ?

## 2023-04-05 MED ORDER — CEFAZOLIN SODIUM 1 G IJ SOLR
1 | INTRAMUSCULAR | Status: DC | PRN
Start: 2023-04-05 — End: 2023-04-05
  Administered 2023-04-05: 20:00:00 2 via INTRAVENOUS

## 2023-04-05 MED ORDER — LACTATED RINGERS IV BOLUS
Freq: Once | INTRAVENOUS | Status: DC
Start: 2023-04-05 — End: 2023-04-06

## 2023-04-05 MED ORDER — KETOROLAC TROMETHAMINE 30 MG/ML IJ SOLN
30 | INTRAMUSCULAR | Status: DC | PRN
Start: 2023-04-05 — End: 2023-04-05
  Administered 2023-04-05: 21:00:00 30 via INTRAVENOUS

## 2023-04-05 MED ORDER — OXYCODONE HCL 5 MG PO TABS
5 | ORAL | Status: DC | PRN
Start: 2023-04-05 — End: 2023-04-08
  Administered 2023-04-07 – 2023-04-08 (×3): 5 mg via ORAL

## 2023-04-05 MED ORDER — EPHEDRINE SULFATE (PRESSORS) 25 MG/5ML IV SOSY
25 | INTRAVENOUS | Status: DC | PRN
Start: 2023-04-05 — End: 2023-04-05
  Administered 2023-04-05 (×22): 5 via INTRAVENOUS
  Administered 2023-04-05: 20:00:00 10 via INTRAVENOUS
  Administered 2023-04-05 (×2): 5 via INTRAVENOUS

## 2023-04-05 MED ORDER — OXYTOCIN 0.06 UNIT/ML BOLUS FROM THE BAG (POST-PARTUM)
30 | INTRAVENOUS | Status: DC | PRN
Start: 2023-04-05 — End: 2023-04-06

## 2023-04-05 MED ORDER — OXYTOCIN 30 UNITS IN 500 ML INFUSION
30 | INTRAVENOUS | Status: DC | PRN
Start: 2023-04-05 — End: 2023-04-06

## 2023-04-05 MED ORDER — ONDANSETRON HCL 4 MG/2ML IJ SOLN
4 | Freq: Four times a day (QID) | INTRAMUSCULAR | Status: DC | PRN
Start: 2023-04-05 — End: 2023-04-08

## 2023-04-05 MED ORDER — DEXMEDETOMIDINE HCL 200 MCG/2ML IV SOLN
200 | INTRAVENOUS | Status: DC | PRN
Start: 2023-04-05 — End: 2023-04-05
  Administered 2023-04-05: 20:00:00 5 via INTRATHECAL

## 2023-04-05 MED ORDER — LACTATED RINGERS IV SOLN
INTRAVENOUS | Status: DC | PRN
Start: 2023-04-05 — End: 2023-04-05
  Administered 2023-04-05 (×2): via INTRAVENOUS

## 2023-04-05 MED ORDER — KETOROLAC TROMETHAMINE 30 MG/ML IJ SOLN
30 | Freq: Four times a day (QID) | INTRAMUSCULAR | Status: AC
Start: 2023-04-05 — End: 2023-04-06
  Administered 2023-04-06 (×4): 30 mg via INTRAVENOUS

## 2023-04-05 MED ORDER — DOCUSATE SODIUM 100 MG PO CAPS
100 | Freq: Two times a day (BID) | ORAL | Status: DC
Start: 2023-04-05 — End: 2023-04-08
  Administered 2023-04-06 – 2023-04-08 (×6): 100 mg via ORAL

## 2023-04-05 MED ORDER — NORMAL SALINE FLUSH 0.9 % IV SOLN
0.9 | INTRAVENOUS | Status: DC | PRN
Start: 2023-04-05 — End: 2023-04-06

## 2023-04-05 MED ORDER — NORMAL SALINE FLUSH 0.9 % IV SOLN
0.9 | INTRAVENOUS | Status: DC | PRN
Start: 2023-04-05 — End: 2023-04-08

## 2023-04-05 MED ORDER — ONDANSETRON HCL 4 MG/2ML IJ SOLN
4 | INTRAMUSCULAR | Status: AC
  Administered 2023-04-05: 20:00:00 8 mg via INTRAVENOUS

## 2023-04-05 MED ORDER — PHENYLEPHRINE HCL (PRESSORS) 10 MG/ML IV SOLN
10 | INTRAVENOUS | Status: AC
Start: 2023-04-05 — End: ?

## 2023-04-05 MED ORDER — OXYTOCIN 30 UNITS IN 500 ML INFUSION
30 | INTRAVENOUS | Status: DC | PRN
Start: 2023-04-05 — End: 2023-04-05
  Administered 2023-04-05: 20:00:00 500 via INTRAVENOUS

## 2023-04-05 MED ORDER — SODIUM CHLORIDE 0.9 % IV SOLN
0.9 | INTRAVENOUS | Status: DC | PRN
Start: 2023-04-05 — End: 2023-04-08

## 2023-04-05 MED ORDER — SODIUM CHLORIDE (PF) 0.9 % IJ SOLN
0.9 | Freq: Once | INTRAMUSCULAR | Status: DC
Start: 2023-04-05 — End: 2023-04-06

## 2023-04-05 MED ORDER — OXYCODONE HCL 10 MG PO TABS
10 | ORAL | Status: DC | PRN
Start: 2023-04-05 — End: 2023-04-08
  Administered 2023-04-06 – 2023-04-08 (×8): 10 mg via ORAL

## 2023-04-05 MED ORDER — KETOROLAC TROMETHAMINE 30 MG/ML IJ SOLN
30 | INTRAMUSCULAR | Status: AC
Start: 2023-04-05 — End: ?

## 2023-04-05 MED ORDER — PHENYLEPHRINE HCL (PRESSORS) 10 MG/ML IV SOLN
10 | INTRAVENOUS | Status: DC | PRN
Start: 2023-04-05 — End: 2023-04-05
  Administered 2023-04-05 (×23): 50 via INTRAVENOUS

## 2023-04-05 MED ORDER — ACETAMINOPHEN 325 MG PO TABS
325 | Freq: Once | ORAL | Status: DC
Start: 2023-04-05 — End: 2023-04-06

## 2023-04-05 MED ORDER — EPHEDRINE SULFATE (PRESSORS) 25 MG/5ML IV SOSY
25 | INTRAVENOUS | Status: AC
Start: 2023-04-05 — End: ?

## 2023-04-05 MED ORDER — LANOLIN EX CREAM
CUTANEOUS | Status: DC | PRN
Start: 2023-04-05 — End: 2023-04-08

## 2023-04-05 MED ORDER — LACTATED RINGERS IV BOLUS
Freq: Once | INTRAVENOUS | Status: AC
Start: 2023-04-05 — End: 2023-04-05
  Administered 2023-04-05: 17:00:00 1000 mL via INTRAVENOUS

## 2023-04-05 MED ORDER — ONDANSETRON 4 MG PO TBDP
4 | Freq: Three times a day (TID) | ORAL | Status: DC | PRN
Start: 2023-04-05 — End: 2023-04-08

## 2023-04-05 MED ORDER — LACTATED RINGERS IV SOLN
INTRAVENOUS | Status: DC
Start: 2023-04-05 — End: 2023-04-06

## 2023-04-05 MED ORDER — CEFAZOLIN SODIUM 1 G IJ SOLR
1 | INTRAMUSCULAR | Status: AC
Start: 2023-04-05 — End: ?

## 2023-04-05 MED ORDER — BUPIVACAINE IN DEXTROSE 0.75-8.25 % IT SOLN
INTRATHECAL | Status: DC | PRN
Start: 2023-04-05 — End: 2023-04-05
  Administered 2023-04-05: 20:00:00 1.2

## 2023-04-05 MED ORDER — IBUPROFEN 400 MG PO TABS
400 | Freq: Three times a day (TID) | ORAL | Status: DC
Start: 2023-04-05 — End: 2023-04-08
  Administered 2023-04-07 – 2023-04-08 (×5): 800 mg via ORAL

## 2023-04-05 MED ORDER — OXYTOCIN 30 UNITS IN 500 ML INFUSION
30 | INTRAVENOUS | Status: AC
Start: 2023-04-05 — End: ?

## 2023-04-05 MED ORDER — SOD CITRATE-CITRIC ACID 500-334 MG/5ML PO SOLN
500-334 | ORAL | Status: AC
  Administered 2023-04-05: 20:00:00 30 mL via ORAL

## 2023-04-05 MED FILL — CEFAZOLIN SODIUM 1 G IJ SOLR: 1 g | INTRAMUSCULAR | Qty: 2000

## 2023-04-05 MED FILL — AKOVAZ 25 MG/5ML IV SOSY: 25 MG/5ML | INTRAVENOUS | Qty: 5

## 2023-04-05 MED FILL — ONDANSETRON HCL 4 MG/2ML IJ SOLN: 4 MG/2ML | INTRAMUSCULAR | Qty: 4

## 2023-04-05 MED FILL — VITAMIN K1 1 MG/0.5ML IJ SOLN: 1 MG/0.5ML | INTRAMUSCULAR | Qty: 0.5

## 2023-04-05 MED FILL — SOD CITRATE-CITRIC ACID 500-334 MG/5ML PO SOLN: 500-334 MG/5ML | ORAL | Qty: 30

## 2023-04-05 MED FILL — OXYTOCIN 30 UNITS IN 500 ML INFUSION: 30 UNIT/500ML | INTRAVENOUS | Qty: 500

## 2023-04-05 MED FILL — FAMOTIDINE (PF) 20 MG/2ML IV SOLN: 20 MG/2ML | INTRAVENOUS | Qty: 2

## 2023-04-05 MED FILL — ERYTHROMYCIN 5 MG/GM OP OINT: 5 MG/GM | OPHTHALMIC | Qty: 1

## 2023-04-05 MED FILL — KETOROLAC TROMETHAMINE 30 MG/ML IJ SOLN: 30 MG/ML | INTRAMUSCULAR | Qty: 1

## 2023-04-05 MED FILL — PHENYLEPHRINE HCL (PRESSORS) 10 MG/ML IV SOLN: 10 MG/ML | INTRAVENOUS | Qty: 1

## 2023-04-05 NOTE — Progress Notes (Signed)
CNM Prenatal Office Note  Subjective:  Krystal Bird is here for a return obstetrical visit. Today she is [redacted]w[redacted]d weeks EGA.  Pt does feel fetal movement regularly. Denies headaches, RUQ pain, or visual changes as well as contractions, vaginal bleeding or leaking of fluid.     Problems/complaints today:  None  Objective:  Mother's Prenatal Vitals  BP: 122/75  Weight - Scale: 105.7 kg (233 lb)  Pulse: 65  Patient Position: Sitting  Prenatal Fetal Information  Fundal Height (cm): 36 cm  Fetal HR: 128  Movement: Present  Cervical Exam  Dilation (cm): Fingertip  Dil/Eff/Sta  Dilation (cm): Fingertip  Pt is A&Ox3, in no acute distress. Normocephalic, atraumatic. PERRL. Resp even and non-labored. Skin pink, warm & dry. Gravid abdomen. MAE's well. Gait steady.   Assessment:    IUP at [redacted]w[redacted]d wks      Diagnosis Orders   1. [redacted] weeks gestation of pregnancy  Chlamydia, Gonorrhea, Trichomoniasis    Strep B DNA probe, amplification      2. Encounter for prenatal care of first pregnancy, third trimester  Chlamydia, Gonorrhea, Trichomoniasis    Strep B DNA probe, amplification      3. Antenatal screening for streptococcus B  Strep B DNA probe, amplification        Plan:   Problems/Complatins Management Plan:  None  Routine OB Management Plan:  GBS collected and labor education completed. Encourage perineal massage. Discussed pain management options during labor and birth plan. Pt counseled on counseled on balanced nutrition, adequate fluid intake, taking PNV daily, and exercise along with GHTN precautions, Kick count, and Preterm labor  Continue with routine prenatal care.  Antenatal surveillance not indicated  RTC in 1 wks for prenatal visit    MEDICATIONS:  No orders of the defined types were placed in this encounter.    ORDERS:  Orders Placed This Encounter   Procedures    Chlamydia, Gonorrhea, Trichomoniasis    Strep B DNA probe, amplification       More than 50% of this 20 min visit was education and  counseling.    ORDERS:  Orders Placed This Encounter   Procedures    Chlamydia, Gonorrhea, Trichomoniasis    Strep B DNA probe, amplification       I Edison Simon, LPN, am scribing for and in the presence of Antony Salmon, [place]  04/05/23  11:30 AM CDT   I have seen and examined the patient independently.  I reviewed all laboratory and imaging studies that are relevant.  I have reviewed and made any appropriate changes to the HPI.    Electronically signed by Antony Salmon, APRN - CNM on 04/05/23  at 12:15 PM CDT

## 2023-04-05 NOTE — Progress Notes (Signed)
Pt presents today for routine prenatal visit. Pt denies vaginal bleeding, cramping, or leaking of fluid +fetal movement.     Pt would like to discuss induction.

## 2023-04-05 NOTE — Other (Signed)
Pt transferred to room 216 via stretcher, husband at side. Peri care complete, fresh pad and mesh panties applied. Oriented to room, time allowed for questions none voiced.

## 2023-04-05 NOTE — Other (Signed)
Dr Robina Ade notified of pt blood loss and ordered a CBC.

## 2023-04-05 NOTE — Other (Signed)
EFM and toco removed from soft non tender abdomen, pt was shaved, iv fluid off pump and infusing. Pt taken to OR per DR slatter order for C/S.

## 2023-04-05 NOTE — Anesthesia Pre-Procedure Evaluation (Signed)
Department of Anesthesiology  Preprocedure Note       Name:  Krystal Bird   Age:  27 y.o.  DOB:  [birth year]                                          MRN:  [id number]         Date:  04/07/2023      Surgeon: * No surgeons listed *    Procedure: * No procedures listed *    Medications prior to admission:   Prior to Admission medications    Medication Sig Start Date End Date Taking? Authorizing Provider   ondansetron (ZOFRAN) 4 MG tablet Take 1 tablet by mouth daily as needed for Nausea or Vomiting 03/23/23   Antony Salmon, APRN - CNM   sertraline (ZOLOFT) 50 MG tablet Take 1 tablet by mouth in the morning and at bedtime 01/13/23 03/13/23  Antony Salmon, APRN - CNM   Prenat-B2-B6-B12-D3-FA (PRENA1 PO) Take by mouth    [provider]       Current medications:    Current Facility-Administered Medications   Medication Dose Route Frequency Provider Last Rate Last Admin    polyethylene glycol (GLYCOLAX) packet 17 g  17 g Oral Daily Slatter Alvino Chapel D, MD   17 g at 04/06/23 1006    ondansetron (ZOFRAN) injection 4 mg  4 mg IntraVENous Q6H PRN Slatter Alvino Chapel D, MD        sodium chloride flush 0.9 % injection 5-40 mL  5-40 mL IntraVENous 2 times per day Glenford Bayley D, MD   10 mL at 04/06/23 1955    sodium chloride flush 0.9 % injection 5-40 mL  5-40 mL IntraVENous PRN Slatter Alvino Chapel D, MD        0.9 % sodium chloride infusion   IntraVENous PRN Slatter Alvino Chapel D, MD        acetaminophen (TYLENOL) tablet 1,000 mg  1,000 mg Oral q8h Slatter Alvino Chapel D, MD   1,000 mg at 04/06/23 1804    ibuprofen (ADVIL;MOTRIN) tablet 800 mg  800 mg Oral Q8H Slatter Alvino Chapel D, MD   800 mg at 04/06/23 2257    ondansetron (ZOFRAN-ODT) disintegrating tablet 4 mg  4 mg Oral Q8H PRN Slatter Alvino Chapel D, MD        Or    ondansetron [facility]) injection 4 mg  4 mg IntraVENous Q6H PRN Slatter Alvino Chapel D, MD        docusate sodium (COLACE) capsule 100 mg  100 mg Oral BID Slatter Alvino Chapel D, MD   100  mg at 04/06/23 1954    Lanolin cream   Topical Q1H PRN Slatter Smitty Cords, MD        measles, mumps & rubella vaccine (MMR) injection 0.5 mL  0.5 mL SubCUTAneous Prior to discharge Slatter Smitty Cords, MD        Tetanus-Diphth-Acell Pertussis (BOOSTRIX) injection 0.5 mL  0.5 mL IntraMUSCular Prior to discharge Slatter Margo Aye, Jaclynn Major, MD        oxyCODONE (ROXICODONE) immediate release tablet 5 mg  5 mg Oral Q4H PRN Slatter Alvino Chapel D, MD        Or    oxyCODONE HCl (OXY-IR) immediate release tablet 10 mg  10 mg Oral Q4H PRN Slatter Smitty Cords, MD   10 mg at  04/06/23 2257    sertraline (ZOLOFT) tablet 50 mg  50 mg Oral BID Slatter Alvino Chapel D, MD   50 mg at 04/06/23 1954       Allergies:  No Known Allergies    Problem List:    Patient Active Problem List   Diagnosis Code    Spells of decreased attentiveness R68.89    Anxiety F41.9    Other headache syndrome G44.89    Syncope, near R55    Dizzy spells R42    [redacted] weeks gestation of pregnancy Z3A.36    Fetal distress affecting care O36.8990       Past Medical History:        Diagnosis Date    Headache     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 No pertinent surgical history.    Social History:    Social History     Tobacco Use    Smoking status: Never    Smokeless tobacco: Not on file   Substance Use Topics    Alcohol use: No                                Counseling given: Not Answered      Vital Signs (Current):   Vitals:    04/06/23 0336 04/06/23 1354 04/06/23 2257 04/06/23 2327   BP: 126/85 112/62     Pulse: 60 61     Resp:  16 16 16    Temp: 97.7 F (36.5 C) 98.2 F (36.8 C)     TempSrc:  Temporal     SpO2: 100% 100%     Weight:       Height:                                                  BP Readings from Last 3 Encounters:   04/06/23 112/62   04/05/23 122/75   03/23/23 119/66       NPO Status:                                                                                 BMI:   Wt Readings from  Last 3 Encounters:   04/05/23 105.7 kg (233 lb)   04/05/23 105.7 kg (233 lb)   03/23/23 103.9 kg (229 lb)     Body mass index is 37.61 kg/m.    CBC:   Lab Results   Component Value Date/Time    WBC 13.1 04/06/2023 06:20 AM    RBC 3.10 04/06/2023 06:20 AM    HGB 10.3 04/06/2023 06:20 AM    HCT 30.8 04/06/2023 06:20 AM    MCV 99.4 04/06/2023 06:20 AM    RDW 12.3 04/06/2023 06:20 AM    PLT 152 04/06/2023 06:20 AM       CMP:   Lab Results   Component Value Date/Time    NA 137 04/05/2023 12:57 PM    K 4.0 04/05/2023  12:57 PM    K 4.5 03/09/2023 12:43 PM    CL 104 04/05/2023 12:57 PM    CO2 20 04/05/2023 04:20 PM    BUN 6 04/05/2023 12:57 PM    CREATININE 0.6 04/05/2023 04:20 PM    CREATININE 0.4 04/05/2023 12:57 PM    LABGLOM >90 04/05/2023 04:20 PM    GLUCOSE 76 04/05/2023 12:57 PM    CALCIUM 8.4 04/05/2023 12:57 PM    BILITOT 0.2 04/05/2023 12:57 PM    ALKPHOS 120 04/05/2023 12:57 PM    AST 15 04/05/2023 12:57 PM    ALT 9 04/05/2023 12:57 PM       POC Tests:   Recent Labs     04/05/23  1620   POCGLU 104*   POCNA 137   POCK 4.9   POCCL 107   POCHCT 34*       Coags:   Lab Results   Component Value Date/Time    PROTIME 13.6 02/26/2023 12:40 PM    INR 1.07 02/26/2023 12:40 PM       HCG (If Applicable): No results found for: "PREGTESTUR", "PREGSERUM", "HCG", "HCGQUANT"     ABGs: No results found for: "PHART", "PO2ART", "PCO2ART", "HCO3ART", "BEART", "O2SATART"     Type & Screen (If Applicable):  No results found for: "LABABO"    Drug/Infectious Status (If Applicable):  Lab Results   Component Value Date/Time    HIV Non-reactive 10/04/2022 11:51 AM       COVID-19 Screening (If Applicable):   Lab Results   Component Value Date/Time    COVID19 Not Detected 02/26/2023 03:00 PM           Anesthesia Evaluation  Patient summary reviewed  : History of anesthetic complications: pt has received GA in past withut issue. no known family hx of issues w GA.  Airway: Mallampati: II  TM distance: >3 FB   Neck ROM: full  Mouth opening: > =  3 FB   Dental:          Pulmonary:Negative Pulmonary ROS and normal exam  breath sounds clear to auscultation                             Cardiovascular:  Exercise tolerance: good (>4 METS)          Rhythm: regular  Rate: normal                 ROS comment: Hx of Pregnancy Pots, as per pt, diagnosed by nurse midwife      Neuro/Psych:   (+) seizures:, headaches:, psychiatric history:             ROS comment: Psychogenic nonepileptic seizures GI/Hepatic/Renal: Neg GI/Hepatic/Renal ROS            Endo/Other: Negative Endo/Other ROS                    Abdominal:             Vascular:          Other Findings: Hx negative for the following: chronic back pain, disease/surgery or instrumentation, paresthesia, foot drop. No known autoimmmune or neuromuscular disease. No hx of blood clots of any form, clotting disorders, not on blood thinners.  Dentition and oral mucosa intact    Labs: wbc: 10.4, h/h 12.3, 35.1, plates 132            Anesthesia Plan      spinal  and general     ASA 2     (Informed consent obtained for: primary plan: spinal, +/- mac, emergency plan: GA)        Anesthetic plan and risks discussed with patient.    Use of blood products discussed with patient whom.    Plan discussed with surgical team.                    Adrian Saran, APRN - CRNA   04/07/2023

## 2023-04-05 NOTE — Other (Signed)
R.Johnson,CNM updated on fetal baseline, orders received to have pt sit in chair due to POTS.

## 2023-04-05 NOTE — Op Note (Signed)
Operative Note      Patient: Krystal Bird  Date of Birth: [birth year]  MRN: [id number]    Date of Procedure: 04/05/2023    Preop Diagnosis:  IUP at 36 wks  POTS  NRFHT    Post-Op Diagnosis: Same       Procedure(s):  CESAREAN SECTION    Surgeon(s):  Slatter Smitty Cords, MD    Assistant:   First Assistant: Janit Bern J    Anesthesia: Spinal    Estimated Blood Loss (mL): 500    Complications: None    Specimens:   * No specimens in log *    Implants:  * No implants in log *      Drains:   [REMOVED] Urinary Catheter 02/26/23 Foley (Removed)   Urine Color Yellow 02/27/23 0652   Urine Appearance Clear 02/27/23 0652   Collection Container Standard 02/27/23 4403   Securement Method Securing device (Describe) 02/27/23 0652   Catheter Care  Soap and water 02/27/23 0155   Catheter Best Practices  Drainage tube clipped to bed;Catheter secured to thigh;Tamper seal intact;Bag below bladder;Bag not on floor;Lack of dependent loop in tubing;Drainage bag less than half full 02/27/23 0155   Status Draining 02/27/23 0652   Output (mL) 600 mL 02/27/23 0652       [REMOVED] Urinary Catheter 04/05/23 Foley (Removed)   Urine Color Yellow 04/05/23 2313   Urine Appearance Clear 04/05/23 2313   Status Draining 04/05/23 2313   Output (mL) 700 mL 04/06/23 0112       Findings:  Infection Present At Time Of Surgery (PATOS) (choose all levels that have infection present):  No infection present  Other Findings: Normal uterus tubes and ovaries    Detailed Description of Procedure:     The patient was seen in the Holding Room. The risks, benefits, complications, treatment options, and expected outcomes were discussed with the patient.  The patient concurred with the proposed plan, giving informed consent.  The site of surgery properly noted/marked. The patient was taken to the Operating Room  identified as Krystal Bird and the procedure verified as C-Section Delivery. A Time Out was held and the above information confirmed.    After induction of  anesthesia, the patient was draped and prepped in the usual sterile manner. A Pfannenstiel incision was made and carried down through the subcutaneous tissue to the fascia. Fascial incision was made and extended transversely. The fascia was separated from the underlying rectus tissue superiorly and inferiorly. The peritoneum was identified and entered. Peritoneal incision was extended longitudinally. A low transverse uterine incision was made.  Delivered from vertex presentation was a 2960 gram female with Apgar scores of 4 at one minute and 7 at five minutes, 9 at 10 minutes. After the umbilical cord was clamped and cut cord blood was obtained for evaluation. The placenta was removed intact and appeared normal. The uterine outline, tubes and ovaries appeared normal. The uterus was exteriorized and incision was closed with running sutures of 0 Vicryl. A second imbricating layer of the same suture was placed with excellent Hemostasis obtained. Posterior and paracolic gutters were cleared of all clots and debris.  The uterus was returned to the abdomen.  Peritoneum was closed using 2-0 vicryl in a running fashion.  The fascia was then reapproximated with running sutures of 1-Stratafix. The skin was reapproximated with 3-0 monocryl in a subcuticular fashion.      Instrument, sponge, and needle counts were correct prior the abdominal closure and at the  conclusion of the case.     Implants: Interceed           Complications:  none         Disposition: PACU - hemodynamically stable.           Condition: infant stable to special care nursery

## 2023-04-05 NOTE — Other (Signed)
Dr Robina Ade notified of prolonged decel, Oxygen ordered and on pt. Prepare pt for c/s and will re evaluate. Will call dr slatter if needed sooner. Prepping pt now.

## 2023-04-05 NOTE — Other (Signed)
This nurse remained at bedside from 1436 to now. Prolonged decel noted, R.Johnson,cnm notified and Dr Robina Ade.

## 2023-04-05 NOTE — Other (Signed)
Assist pt up to wheelchair to go into nursery to visit baby.

## 2023-04-05 NOTE — Anesthesia Post-Procedure Evaluation (Signed)
Department of Anesthesiology  Postprocedure Note    Patient: Tima Than  MRN: [id number]  Birthdate: [birth year]  Date of evaluation: 04/05/2023    Procedure Summary       Date: 04/05/23 Room / Location:     Anesthesia Start: 1540 Anesthesia Stop: 1700    Procedure: Procedure Not Yet Scheduled Diagnosis:     Scheduled Providers:  Responsible Provider:     Anesthesia Type: Not recorded ASA Status: Not recorded            Anesthesia Type: No value filed.    Aldrete Phase I: Aldrete Score: 9    Aldrete Phase II:      Anesthesia Post Evaluation    Patient location during evaluation: PACU  Patient participation: complete - patient participated  Level of consciousness: awake and alert  Pain score: 0  Airway patency: patent  Nausea & Vomiting: no nausea and no vomiting  Cardiovascular status: hemodynamically stable  Respiratory status: acceptable, spontaneous ventilation and room air  Hydration status: euvolemic  Comments: Pt resting comfortably. No complaints of headache, current nausea, or pain. Spinal puncture site, CDI. No erythema, edema, or drainage noted.  Multimodal analgesia pain management approach  Pain management: adequate    No notable events documented.

## 2023-04-05 NOTE — Other (Signed)
H.Phelps,rn called and discussed pt fetal strip with dr slatter. Dr Robina Ade ordered to take the pt back to OR for primary cs. Or crew notified.

## 2023-04-05 NOTE — Other (Signed)
Assist pt back to room and into bed from nursery visit. No distress noted, will monitor.

## 2023-04-05 NOTE — Other (Signed)
R.Johnson,cnm notified of low baseline with reccurent variables, pt turned from left to right, and back again.

## 2023-04-05 NOTE — H&P (Signed)
[facility]    [place] History and Physical    Provider: Antony Salmon, APRN - CNM  Date: 04/11/2023            4:43 PM    Patient Name: Krystal Bird  Patient DOB: [birth year]  MRN: [id number]   Room/Bed: 0216/0216-01    SUBJECTIVE:    CHIEF COMPLAINT:  IV fluid replacement, Hx POTS, fetal intolerance  Chief Complaint   Patient presents with    IV Fluids       HISTORY OF PRESENT ILLNESS:      Krystal Bird a 27 y.o. female at [redacted]w[redacted]d presents with a chief complaint as above and is being admitted for fetal distress. She presented for IV fluid therapy due to near syncopal episodes related to POTS. She has been receiving fluids which improves her symptoms. During her out patient visit variable decelerations with bradycardic episodes was noted. IV fluids, position changes, and O2 therapy did not resolve her symptoms. Dr. Robina Ade was notified of persistent category 2 tracing and decision was made to admit and deliver via emergency c/s.     Contractions: Yes  Rupture of membranes: No  Vaginal bleeding: No    REVIEW OF SYSTEMS:  A comprehensive review of systems was negative except for what was noted in the HPI.     OBJECTIVE:     Estimated Due Date:   Estimated Date of Delivery: 04/30/23   Patient's last menstrual period was 07/27/2022.      PREGNANCY RISK FACTORS:       Patient Active Problem List   Diagnosis    Spells of decreased attentiveness    Anxiety    Other headache syndrome    Syncope, near    Dizzy spells    [redacted] weeks gestation of pregnancy    Fetal distress affecting care       Antenatal Steroids:  no    PAST OB HISTORY:  OB History   Gravida Para Term Preterm AB Living   1 1 0 1 0 1   SAB IAB Ectopic Molar Multiple Live Births   0 0 0 0 0 1      # Outcome Date GA Lbr Len/2nd Weight Sex Type Anes PTL Lv   1 Preterm 04/05/23 104w3d  2.96 kg (6 lb 8.4 oz) F CS-LTranv Spinal Y LIV      Name: Krystal Bird      Apgar1: 4  Apgar5: 7           Past Medical History:        Diagnosis Date    Headache      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 No pertinent surgical history.    Allergies:    Patient has no known allergies.    Social History:    Social History     Socioeconomic History    Marital status: Single     Spouse name: Not on file    Number of children: Not on file    Years of education: Not on file    Highest education level: Not on file   Occupational History    Not on file   Tobacco Use    Smoking status: Never    Smokeless tobacco: Not on file   Substance and Sexual Activity    Alcohol use: No    Drug use: No    Sexual activity: Not on file   Other  Topics Concern    Not on file   Social History Narrative    Not on file     Social Determinants of Health     Financial Resource Strain: Low Risk  (04/05/2023)    Overall Financial Resource Strain (CARDIA)     Difficulty of Paying Living Expenses: Not hard at all   Food Insecurity: Not on file (04/09/2023)   Transportation Needs: No Transportation Needs (04/05/2023)    Transportation Problems [facility] HRSN)     In the past 12 months, has lack of reliable transportation kept you from medical appointments, meetings, work or from getting things needed for daily living?: No   Physical Activity: Sufficiently Active (04/05/2023)    Physical Activity ([facility] HRSN)     In the last 30 days, other than the activities you did for work, on average, how many days per week did you engage in moderate exercise (like walking fast, running, jogging, dancing, swimming, biking, or other similar activites)?: 4     Number of minutes of exercise per week:: 240   Stress: Not on file   Social Connections: Socially Integrated (04/05/2023)    Social Connections [facility])     If for any reason you need help with day-to-day activities such as bathing, preparing meals, shopping, managing finances, etc., do you get the help you need?: I don't need any help   Intimate Partner Violence: Not on file   Housing Stability: Low Risk  (04/09/2023)    Housing  Stability Vital Sign     Unable to Pay for Housing in the Last Year: No     Number of Times Moved in the Last Year: 1     Homeless in the Last Year: No       Family History:   History reviewed. No pertinent family history.    Medications Prior to Admission:  No medications prior to admission.    PHYSICAL EXAM:     Vitals:    04/08/23 0802   BP: 114/61   Pulse: 60   Resp:    Temp: 97.6 F (36.4 C)   SpO2: 96%       General appearance:  awake, alert, cooperative, no apparent distress, and appears stated age  Skin:  Warm, dry, no rashes or erythema  Neurologic:  Awake, alert, oriented to name, place and time.  Cranial nerves II-XII are grossly intact.  Motor is 5 out of 5 bilaterally.  Lungs:  No increased work of breathing, good air exchange, clear to auscultation bilaterally, no crackles or wheezing  Heart:  Normal apical impulse, regular rate and rhythm, normal S1 and S2, no S3 or S4, and no murmur noted  Breast:  Deferred   Abdomen: Gravid, Non-Tender  Extremities:  no calf tenderness, non edematous, DTRs: normal    Pelvic Exam:  FETALPRESENTATION:Cephalic    MEMBRANES:  Intact                                FETAL HEART RATE:    Baseline: 100      Variability: moderate, minimal      Accelerations: absent      Decelerations: late, variable      FHR: Category: 2          CONTRACTIONS:   Frequency: 2-5 minutes  Duration: 60 seconds  Intensity: Mild by palpation  Resting tone: palpates soft between contractions, adequate resting tone     Lab  Results:   Blood Type/Rh:    ABO/Rh   Date Value Ref Range Status   04/05/2023 A POS  Final     Antibody Screen:    Antibody Screen   Date Value Ref Range Status   04/05/2023 NEG  Final     Hemoglobin:   Hemoglobin   Date Value Ref Range Status   04/06/2023 10.3 (L) 12.0 - 16.0 g/dL Final     Hematocrit:   Hematocrit   Date Value Ref Range Status   04/06/2023 30.8 (L) 37.0 - 47.0 % Final     Platelet Count:   Platelets   Date Value Ref Range Status   04/06/2023 152 130 - 400 K/uL  Final         ASSESSMENT/PLAN:  Muntas Recktenwald is a 27 y.o. female  G1P0101 At [redacted]w[redacted]d    Other:   Admit to LDR with routine order set  Prepare for emergency c/s with Dr. Robina Ade attending        Risks, benefits, alternatives and possible complications have been discussed in detail with the patient. Admission, and post admission procedures and expectations were discussed in detail. All questions were answered.    Antony Salmon, APRN - CNM

## 2023-04-05 NOTE — Other (Signed)
This nurse attempted to trace fht during spinal placement, unable to find after trying the entire spinal. Hard to find after spinal placed, once found they were 126 and R.Johnson,cnm at bedside during spinal and aware.

## 2023-04-05 NOTE — Other (Signed)
Pt ambulated to labor and delivery, reports she is here for weekly IV fluids, denies leaking of fluid, denies vaginal bleeding, denies contractions, TOCO and EFM placed to soft non-tender abd.

## 2023-04-05 NOTE — Other (Signed)
US at bedside

## 2023-04-05 NOTE — Other (Signed)
[place] finished. Bpp 8/8. R.Johnson,cnm notified.

## 2023-04-06 LAB — CBC
Hematocrit: 30.8 % — ABNORMAL LOW (ref 37.0–47.0)
Hemoglobin: 10.3 g/dL — ABNORMAL LOW (ref 12.0–16.0)
MCH: 33.2 pg — ABNORMAL HIGH (ref 27.0–31.0)
MCHC: 33.4 g/dL (ref 33.0–37.0)
MCV: 99.4 fL — ABNORMAL HIGH (ref 81.0–99.0)
MPV: 11.5 fL (ref 9.4–12.3)
Platelets: 152 10*3/uL (ref 130–400)
RBC: 3.1 M/uL — ABNORMAL LOW (ref 4.20–5.40)
RDW: 12.3 % (ref 11.5–14.5)
WBC: 13.1 10*3/uL — ABNORMAL HIGH (ref 4.8–10.8)

## 2023-04-06 LAB — CBC WITH AUTO DIFFERENTIAL
Basophils %: 0.3 % (ref 0.0–1.0)
Basophils Absolute: 0.1 10*3/uL (ref 0.00–0.20)
Eosinophils %: 0.4 % (ref 0.0–5.0)
Eosinophils Absolute: 0.1 10*3/uL (ref 0.00–0.60)
Hematocrit: 32.2 % — ABNORMAL LOW (ref 37.0–47.0)
Hemoglobin: 11.2 g/dL — ABNORMAL LOW (ref 12.0–16.0)
Immature Granulocytes #: 0.1 10*3/uL
Lymphocytes %: 7.1 % — ABNORMAL LOW (ref 20.0–40.0)
Lymphocytes Absolute: 1.3 10*3/uL (ref 1.1–4.5)
MCH: 33.2 pg — ABNORMAL HIGH (ref 27.0–31.0)
MCHC: 34.8 g/dL (ref 33.0–37.0)
MCV: 95.5 fL (ref 81.0–99.0)
MPV: 11.1 fL (ref 9.4–12.3)
Monocytes %: 4.8 % (ref 0.0–10.0)
Monocytes Absolute: 0.9 10*3/uL (ref 0.00–0.90)
Neutrophils %: 87 % — ABNORMAL HIGH (ref 50.0–65.0)
Neutrophils Absolute: 16.5 10*3/uL — ABNORMAL HIGH (ref 1.5–7.5)
Platelets: 178 10*3/uL (ref 130–400)
RBC: 3.37 M/uL — ABNORMAL LOW (ref 4.20–5.40)
RDW: 12.1 % (ref 11.5–14.5)
WBC: 19 10*3/uL — ABNORMAL HIGH (ref 4.8–10.8)

## 2023-04-06 LAB — STREP B DNA PROBE, AMPLIFICATION: Strep B DNA AMP: NOT DETECTED

## 2023-04-06 LAB — SPECIMEN REJECTION

## 2023-04-06 MED ORDER — POLYETHYLENE GLYCOL 3350 17 G PO PACK
17 | Freq: Every day | ORAL | Status: DC
Start: 2023-04-06 — End: 2023-04-08
  Administered 2023-04-06 – 2023-04-08 (×3): 17 g via ORAL

## 2023-04-06 MED FILL — DOCUSATE SODIUM 100 MG PO CAPS: 100 MG | ORAL | Qty: 1

## 2023-04-06 MED FILL — OXYCODONE HCL 10 MG PO TABS: 10 MG | ORAL | Qty: 1

## 2023-04-06 MED FILL — TYLENOL EXTRA STRENGTH 500 MG PO TABS: 500 MG | ORAL | Qty: 2

## 2023-04-06 MED FILL — KETOROLAC TROMETHAMINE 30 MG/ML IJ SOLN: 30 MG/ML | INTRAMUSCULAR | Qty: 1

## 2023-04-06 MED FILL — SERTRALINE HCL 50 MG PO TABS: 50 MG | ORAL | Qty: 1

## 2023-04-06 MED FILL — POLYETHYLENE GLYCOL 3350 17 G PO PACK: 17 g | ORAL | Qty: 1

## 2023-04-06 NOTE — Progress Notes (Signed)
Postpartum Day 1: Cesarean Delivery    Patient is a G1P0101 that delivered on The patient feels well. The patient denies emotional concerns. Pain is well controlled with current medications. The baby is in level 2 nursery on cpap. Baby is feeding via breast. Urinary output is adequate. The patient is ambulating well. The patient is tolerating a normal diet. Flatus has been passed.    Objective:      Vitals:    04/06/23 0336   BP: 126/85   Pulse: 60   Resp:    Temp: 97.7 F (36.5 C)   SpO2: 100%         General:    alert, appears stated age, and cooperative   Bowel Sounds:  active   Lochia:  appropriate   Uterine Fundus:   firm   Incision:  healing well, no significant drainage, no dehiscence, no significant erythema, Prevena in place   DVT Evaluation:  No evidence of DVT seen on physical exam.     Lab Results   Component Value Date    WBC 13.1 (H) 04/06/2023    HGB 10.3 (L) 04/06/2023    HCT 30.8 (L) 04/06/2023    MCV 99.4 (H) 04/06/2023    PLT 152 04/06/2023     Assessment:     Status post Cesarean section. Doing well postoperatively.     Plan:     Continue current care.

## 2023-04-06 NOTE — Other (Signed)
Assist pt up to restroom, stand by assist needed only. Pt ambulate with strong steady gait, peri care complete, fresh pad and mesh panties applied.

## 2023-04-06 NOTE — Telephone Encounter (Signed)
Pt in LDR, and has delivered

## 2023-04-06 NOTE — Discharge Instructions (Addendum)
Social worker Investment banker, operational provided written documentation for community resources based on SDOH scores or any additional community resources needed. These resources will be provided to the Pt at D/C. Electronically signed by Phillips Hay on 04/06/2023 at 6:21 AM      POSTPARTUM EDUCATION / DISCHARGE PLANNING    Call Doctor:  1. If temp 100.4 or greater.  2. If foul smelling discharge.  3. If discharge changes from pink or yellow, back to red, and bleeding is heavier than a normal period.  4. If you pass large clots.   5. If abdominal incision starts to separate and/or starts to bleeding, is red and warm to touch or has drainage with a foul odor.  6. Report any pain, redness, or warmth of skin in calf of leg which could indicate a blood clot.    Cramping:  1. Cramping is normal; uterus is returning to pre-pregnant state.  2. Moms of twins and mothers of more than one child and breast-feeding mothers will cramp more than first time moms.  3. For relief, place pillow under abdomen and lie on it to apply pressure. Walking may help.    Discharge:   1. Discharge will be dark for first few days (similar to menstrual flow). It will turn to pinkish brown after 2-3 days and creamy yellow for an additional week or two. Moderate flow-use of 4-8 pads daily.  2. Small clots are normal.    Episiotomy Care:  1. May use sitz bath 3-4 times daily.  2. Use analgesic foams or sprays or medicine as ordered.   3. Keep perineal area clean.  4. Continue to use peri bottle after urinating and gently pat from front to back.  5. Stitches will dissolve on their own. If you have stitches, shower only for 2-4 weeks or as directed by your doctor to allow suture line to heal properly. No baths, hot tubs or swimming pools until told it is alright to do so by your doctor.    Abdominal Incision Care:  1. Leave open to air after original dressing is removed.   2. Clean incision with soapy water unless otherwise directed.    Return of Periods:  1. You  should resume menstruating anywhere from 6-8 weeks after birth; up to 24 weeks if breast-feeding.  2. Breast feeding mothers may also resume menstruating during this time.    Breast:  1. ENGORGEMENT, NON-NURSING MOMS:  Wear supportive, well-fitting bra for two weeks, day and night.  May apply ice packs for 20 minutes 4 times daily.  Avoid breast stimulation.  Avoid heat, especially when showering. This will encourage milk letdown.  2. ENGORGEMENT, NURSING MOMS:  Engorgement usually lasts 1-2 days until breasts adjust to babies needs.  3. NIPPLE CARE, NURSING MOMS:  Cleanse nipples with water only. Soap has a drying effect.   4. DO SELF-BREAST EXAMS MONTHLY.    Urine Patterns:  Report inability to urinate, frequency, burning, or urgency to the doctor.    Bowel Patterns:  1. You should have a normal stool by 2nd or 3rd day after birth.  2. Drinking fluids, walking, and eating roughage help promote regular stools.    Hemorrhoid Care:  1. Take 20-minute sitz bath 3-4 times daily.  2. Use TUCKS.  3. Maintain adequate fluid intake.  4. Avoid prolonged sitting.    Hygiene:  1. Apply peri pads from front to back.  2. Always clean from front to back.  3. Shower daily.    Nutrition/Fluids:  1. A weight loss of 10-12 pounds after birth is normal.  2. It may take from 4 weeks to several months to reach your pre-pregnant state.  3. After weight loss stabilizes, weight loss should not exceed a pound per week.    Rest/Sleep:  1. Rest as much as possible.  2. Your first week at home should consist of taking care of yourself and the baby only.    Activity:  1. Limit the following activities for 6 weeks.  Heavy lifting ,nothing heavier than the baby for first few weeks  No douching, tampons or intercourse for 6 weeks.   Wearing constrictive garments around abdomen or legs.  2. May drive in 1-2 weeks or as directed by doctor.   3. Start past-partum exercises.  4. Increased discharge or pain may mean you need to decrease  activities.    Emotions:  You may become depressed, let down or have mood swings and feel weepy for a couple of weeks. This is due to hormonal changes, fatigue, discomfort and over-stimulation, which is normal. Many people refer to this as the baby blues. This should resolve in a few days. If symptoms worsen contact your doctor.     Sexuality:  1. You many have vaginal dryness for first few weeks. May use KY Jelly.  2. You may have difficulty feeling excitement because of fatigue due to recovery and demands of newborn care.  3. There may be a leakage of breast milk during sexual activity. (You may need to wear a bra.)  4. Vaginal tenderness-use the female superior or side-by-side position  5. Resume intercourse-abstain from intercourse until episiotomy is healed and discharge has stopped (or as directed by your doctor).    Contraception:  1. You can become pregnant while breast-feeding.  2. Use contraception as desired and as discussed with your doctor.                          Depression After Childbirth: Care Instructions  Overview     Many women get the "baby blues" during the first few days after childbirth. You may lose sleep, feel irritable, and cry easily. You may feel happy one minute and sad the next. Hormone changes are one cause of these emotional changes. Also, the demands of a new baby, along with visits from relatives or other family needs, can add to the stress. The "baby blues" often peak around thefourth day. Then they ease up in less than 2 weeks.  If your moodiness or anxiety lasts for more than 2 weeks, or if you feel like life is not worth living, you may have postpartum depression. This is different for each person. Some mothers with serious depression may worry intensely about their infant's well-being. Others may feel distant from their child. Some mothers may even feel that they might harm their baby. Some may have signs ofparanoia, wondering if someone is watching them.  Depression is not a  sign of weakness. It's a medical condition that requires treatment. Medicine and counseling often work well to reduce depression. Talkto your doctor about taking antidepressant medicine while breastfeeding.  Follow-up care is a key part of your treatment and safety. Be sure to make and go to all appointments, and call your doctor if you are having problems. It's also a good idea to know your test results and keep alist of the medicines you take.  How do you know if you are depressed?  With all  the changes in your life, you may not know if you are depressed. Pregnancy sometimes causes changes in how you feel that are similar to thesymptoms of depression.  Symptoms of depression include:  Feeling sad or hopeless and losing interest in daily activities. These are the most common symptoms of depression.  Sleeping too much or not enough.  Feeling tired. You may feel as if you have no energy.  Eating too much or too little.  Writing or talking about death, such as writing suicide notes or talking about guns, knives, or pills.  If you or someone you know talks about suicide, self-harm, or feeling hopeless, get help right away. Call the National Suicide Prevention Lifeline at 1-800-273-TALK (609)291-5282) or text HOME to (608)080-1565 to access the CrisisText Line. Consider saving these numbers in your phone.    How can you care for yourself at home?  Be safe with medicines. Take your medicines exactly as prescribed. Call your doctor if you think you are having a problem with your medicine.  Eat a healthy diet so that you can keep up your energy.  Get regular daily exercise, such as walks, to help improve your mood.  Get as much sunlight as possible. Keep your shades and curtains open. Get outside as much as you can.  Avoid using alcohol or other substances to feel better.  Get as much rest and sleep as possible. Avoid doing too much. Being too tired can increase depression.  Play stimulating music throughout your day and  soothing music at night.  Schedule outings and visits with friends and family. Ask them to call you regularly, so that you don't feel alone.  Ask for help with preparing food and other daily tasks. Family and friends are often happy to help with a newborn.  Be honest with yourself and those who care about you. Tell them about your struggle.  Join a support group of new parents. No one can better understand the challenges of caring for a newborn than other new parents.

## 2023-04-06 NOTE — Lactation Note (Signed)
Baby is currently in our NICU. Encouraged mother to start pumping. Hospital grade electric pump provided. Instructions for set up and cleaning given. Hand expression, breast compression encouraged to increase milk transfer while pumping. Instructed to pump for 15 mins every 2-3 hours and to give EBM to nursery nurse so they can date and time EBM and place in freezer. Encouraged mother to use size 21mm flange. Mother is about to take a shower, informed the warm water will be good to encourage let down of milk. Informed mother that there has to be lots of stimulation to the breast by pumping if separated from baby to let her brain know to make lots of milk by raising prolactin hormone. And that is normal to get nothing to drops to 5 ml for 3-5 days before the transitional milk comes in.  Information discussing the benefits of colostrum given. Supply and demand discussed. Mother understands and agrees with feeding plan. Encouragement and support provided. Breastfeeding book and pumping log given. Pump, pump parts, given and set up, basin, soap, syringes, lanolin, hand held pump given. Will call [facility] to get mother an electric breast pump.

## 2023-04-06 NOTE — Care Coordination-Inpatient (Signed)
Social worker Investment banker, operational provided written documentation for community resources based on SDOH scores or any additional community resources needed. These resources will be provided to the Pt at D/C. Electronically signed by Phillips Hay on 04/06/2023 at 6:21 AM

## 2023-04-07 LAB — RPR: RPR: NONREACTIVE

## 2023-04-07 MED FILL — IBUPROFEN 400 MG PO TABS: 400 MG | ORAL | Qty: 2

## 2023-04-07 MED FILL — TYLENOL EXTRA STRENGTH 500 MG PO TABS: 500 MG | ORAL | Qty: 2

## 2023-04-07 MED FILL — OXYCODONE HCL 5 MG PO TABS: 5 MG | ORAL | Qty: 1

## 2023-04-07 MED FILL — OXYCODONE HCL 10 MG PO TABS: 10 MG | ORAL | Qty: 1

## 2023-04-07 MED FILL — POLYETHYLENE GLYCOL 3350 17 G PO PACK: 17 g | ORAL | Qty: 1

## 2023-04-07 MED FILL — SERTRALINE HCL 50 MG PO TABS: 50 MG | ORAL | Qty: 1

## 2023-04-07 MED FILL — DOCUSATE SODIUM 100 MG PO CAPS: 100 MG | ORAL | Qty: 1

## 2023-04-07 NOTE — Plan of Care (Signed)
Problem: Postpartum  Goal: Experiences normal postpartum course  Description:  Postpartum OB-Pregnancy care plan goal which identifies if the mother is experiencing a normal postpartum course  04/07/2023 1612 by Arma Heading, RN  Outcome: Progressing  04/07/2023 0413 by Shannan Harper, RN  Outcome: Progressing  Goal: Appropriate maternal - newborn bonding  Description:  Postpartum OB-Pregnancy care plan goal which identifies if the mother and newborn are bonding appropriately  04/07/2023 1612 by Arma Heading, RN  Outcome: Progressing  04/07/2023 0413 by Shannan Harper, RN  Outcome: Progressing  Goal: Establishment of infant feeding pattern  Description:  Postpartum OB-Pregnancy care plan goal which identifies if the mother is establishing a feeding pattern with their newborn  04/07/2023 1612 by Arma Heading, RN  Outcome: Progressing  04/07/2023 0413 by Shannan Harper, RN  Outcome: Progressing  Goal: Incisions, wounds, or drain sites healing without S/S of infection  04/07/2023 1612 by Arma Heading, RN  Outcome: Progressing  04/07/2023 0413 by Shannan Harper, RN  Outcome: Progressing     Problem: Pain  Goal: Verbalizes/displays adequate comfort level or baseline comfort level  04/07/2023 1612 by Arma Heading, RN  Outcome: Progressing  04/07/2023 0413 by Shannan Harper, RN  Outcome: Progressing     Problem: Infection - Adult  Goal: Absence of infection at discharge  04/07/2023 1612 by Arma Heading, RN  Outcome: Progressing  04/07/2023 0413 by Shannan Harper, RN  Outcome: Progressing  Goal: Absence of infection during hospitalization  04/07/2023 1612 by Arma Heading, RN  Outcome: Progressing  04/07/2023 0413 by Shannan Harper, RN  Outcome: Progressing  Goal: Absence of fever/infection during anticipated neutropenic period  04/07/2023 1612 by Arma Heading, RN  Outcome: Progressing  04/07/2023 0413 by Shannan Harper, RN  Outcome: Progressing     Problem: Safety - Adult  Goal: Free from fall  injury  04/07/2023 1612 by Arma Heading, RN  Outcome: Progressing  04/07/2023 0413 by Shannan Harper, RN  Outcome: Progressing     Problem: Discharge Planning  Goal: Discharge to home or other facility with appropriate resources  04/07/2023 1612 by Arma Heading, RN  Outcome: Progressing  04/07/2023 0413 by Shannan Harper, RN  Outcome: Progressing     Problem: Vaginal Birth or Cesarean Section  Goal: Fetal and maternal status remain reassuring during the birth process  Description:  Birth OB-Pregnancy care plan goal which identifies if the fetal and maternal status remain reassuring during the birth process  04/07/2023 1612 by Arma Heading, RN  Outcome: Completed  04/07/2023 0413 by Shannan Harper, RN  Outcome: Progressing

## 2023-04-07 NOTE — Progress Notes (Signed)
Postpartum Day 2: Cesarean Delivery    Patient is a G1P0101 that delivered on The patient feels well. The patient denies emotional concerns. Pain is well controlled with current medications. The baby is in level 2 nursery. Baby is feeding via both breast and bottle - Similac with iron. Urinary output is adequate. The patient is ambulating well. The patient is tolerating a normal diet. Flatus has been passed.    Objective:      Patient Vitals for the past 8 hrs:  Vitals:    04/07/23 0944   BP: 109/60   Pulse: 56   Resp: 18   Temp: 97.2 F (36.2 C)   SpO2: 96%       General:    alert, appears stated age, and cooperative   Bowel Sounds:  active   Lochia:  appropriate   Uterine Fundus:   firm   Incision:  healing well, no significant drainage, no dehiscence, no significant erythema   DVT Evaluation:  No evidence of DVT seen on physical exam.     Lab Results   Component Value Date    WBC 13.1 (H) 04/06/2023    HGB 10.3 (L) 04/06/2023    HCT 30.8 (L) 04/06/2023    MCV 99.4 (H) 04/06/2023    PLT 152 04/06/2023     Assessment:     Status post Cesarean section. Doing well postoperatively.     Plan:     Continue current care.

## 2023-04-07 NOTE — Lactation Note (Signed)
Baby remains in our NICU. Encouraged mother to continue pumping with our hospital grade electric pump. Mother knows to clean pump parts with hot soapy water, rinse well, and drain. Hand expression, breast compression encouraged to increase milk transfer while pumping. Instructed to pump for 15 mins every 2-3 hours and to give EBM to nursery nurse so they can date and time EBM and place in freezer. Mother  states she has been using size 21mm flange and is getting 1-7 ml of colostrum every 3 hours. Reminded mother about supply and demand. Breastfeeding book given. Lactation will not be here for the next few days, mother knows when baby is discharged there will be a weight check/lactation appointment. Instructions and handouts given over management of sore nipples, engorgement, plugged ducts, mastitis, hydration, nutrition, and medications that could effect milk supply. Mother knows when to call MD if needed. Lactation number and hours provided. Mother knows she can call and make appointment for pre and post feeding weights whenever needed or can call with questions or concerns her entire breastfeeding journey. All questions at this time answered. Support and Encouragement given.

## 2023-04-07 NOTE — Plan of Care (Signed)
Problem: Postpartum  Goal: Experiences normal postpartum course  Description:  Postpartum OB-Pregnancy care plan goal which identifies if the mother is experiencing a normal postpartum course  Outcome: Progressing  Goal: Appropriate maternal - newborn bonding  Description:  Postpartum OB-Pregnancy care plan goal which identifies if the mother and newborn are bonding appropriately  Outcome: Progressing  Goal: Establishment of infant feeding pattern  Description:  Postpartum OB-Pregnancy care plan goal which identifies if the mother is establishing a feeding pattern with their newborn  Outcome: Progressing  Goal: Incisions, wounds, or drain sites healing without S/S of infection  Outcome: Progressing     Problem: Pain  Goal: Verbalizes/displays adequate comfort level or baseline comfort level  Outcome: Progressing     Problem: Infection - Adult  Goal: Absence of infection at discharge  Outcome: Progressing  Goal: Absence of infection during hospitalization  Outcome: Progressing  Goal: Absence of fever/infection during anticipated neutropenic period  Outcome: Progressing     Problem: Safety - Adult  Goal: Free from fall injury  Outcome: Progressing     Problem: Discharge Planning  Goal: Discharge to home or other facility with appropriate resources  Outcome: Progressing

## 2023-04-08 MED ORDER — OXYCODONE HCL 5 MG PO TABS
5 | ORAL_TABLET | ORAL | 0 refills | Status: AC | PRN
Start: 2023-04-08 — End: 2023-04-11
  Filled 2023-04-08: qty 18, 3d supply, fill #0

## 2023-04-08 MED ORDER — IBUPROFEN 800 MG PO TABS
800 | ORAL_TABLET | Freq: Three times a day (TID) | ORAL | 3 refills | Status: AC | PRN
Start: 2023-04-08 — End: ?
  Filled 2023-04-08: qty 30, 10d supply, fill #0

## 2023-04-08 MED ORDER — ERYTHROMYCIN 5 MG/GM OP OINT
5 | OPHTHALMIC | Status: AC
Start: 2023-04-08 — End: ?

## 2023-04-08 MED ORDER — DSS 100 MG PO CAPS
100 | ORAL_CAPSULE | Freq: Two times a day (BID) | ORAL | 0 refills | Status: AC
Start: 2023-04-08 — End: ?

## 2023-04-08 MED ORDER — VITAMIN K1 1 MG/0.5ML IJ SOLN
1 | INTRAMUSCULAR | Status: AC
Start: 2023-04-08 — End: ?

## 2023-04-08 MED FILL — DOCUSATE SODIUM 100MG CAPS: 100 100 MG | ORAL | 15 days supply | Qty: 30 | Fill #0 | Status: AC

## 2023-04-08 MED FILL — ERYTHROMYCIN 5 MG/GM OP OINT: 5 MG/GM | OPHTHALMIC | Qty: 1

## 2023-04-08 MED FILL — VITAMIN K1 1 MG/0.5ML IJ SOLN: 1 MG/0.5ML | INTRAMUSCULAR | Qty: 0.5

## 2023-04-08 MED FILL — TYLENOL EXTRA STRENGTH 500 MG PO TABS: 500 MG | ORAL | Qty: 2

## 2023-04-08 MED FILL — DOCUSATE SODIUM 100 MG PO CAPS: 100 MG | ORAL | Qty: 1

## 2023-04-08 MED FILL — OXYCODONE HCL 10 MG PO TABS: 10 MG | ORAL | Qty: 1

## 2023-04-08 MED FILL — POLYETHYLENE GLYCOL 3350 17 G PO PACK: 17 g | ORAL | Qty: 1

## 2023-04-08 MED FILL — OXYCODONE HCL 5 MG PO TABS: 5 MG | ORAL | Qty: 1

## 2023-04-08 MED FILL — IBUPROFEN 400 MG PO TABS: 400 MG | ORAL | Qty: 2

## 2023-04-08 MED FILL — SERTRALINE HCL 50 MG PO TABS: 50 MG | ORAL | Qty: 1

## 2023-04-08 NOTE — Plan of Care (Signed)
Care plan completed. Ready for discharge.

## 2023-04-08 NOTE — Other (Signed)
Postpartum discharge teaching completed. All questions answered. Instructed pt to call her provider with any concerns or complications. Pt verbalized understanding.

## 2023-04-08 NOTE — Discharge Instructions (Signed)
No lifting greater than 15 pounds for 2 weeks  Nothing in the vagina for 6 weeks  No lotions to dermabond or steristrips  Shower only for 6 weeks  No driving for 2 weeks.

## 2023-04-08 NOTE — Progress Notes (Signed)
CLINICAL PHARMACY NOTE: MEDS TO BEDS    Total # of Prescriptions Filled: 3   The following medications were delivered to the patient:  Current Discharge Medication List        START taking these medications    Details   oxyCODONE (ROXICODONE) 5 MG immediate release tablet Take 1 tablet by mouth every 4 hours as needed for Pain for up to 3 days. Max Daily Amount: 30 mg  Qty: 18 tablet, Refills: 0    Comments: Reduce doses taken as pain becomes manageable  Associated Diagnoses: S/P C-section      ibuprofen (ADVIL;MOTRIN) 800 MG tablet Take 1 tablet by mouth every 8 hours as needed for Pain  Qty: 30 tablet, Refills: 3      docusate sodium (COLACE, DULCOLAX) 100 MG CAPS Take 100 mg by mouth 2 times daily  Qty: 30 capsule, Refills: 0               Additional Documentation:    Handed script to patient at bedside. Paid with card.

## 2023-04-08 NOTE — Discharge Summary (Signed)
Postpartum Day 3: Cesarean Delivery    Patient is a G1P0101 that delivered on The patient feels well. The patient denies emotional concerns. Pain is well controlled with current medications. The baby is in level 2 nursery. Baby is feeding via both breast and bottle - Similac with iron. Urinary output is adequate. The patient is ambulating well. The patient is tolerating a normal diet. Flatus has been passed.    Objective:      Patient Vitals for the past 8 hrs:  Vitals:    04/08/23 0802   BP: 114/61   Pulse: 60   Resp:    Temp: 97.6 F (36.4 C)   SpO2: 96%       General:    alert, appears stated age, and cooperative   Bowel Sounds:  active   Lochia:  appropriate   Uterine Fundus:   firm   Incision:  healing well, no significant drainage, no dehiscence, no significant erythema   DVT Evaluation:  No evidence of DVT seen on physical exam.     Lab Results   Component Value Date    WBC 13.1 (H) 04/06/2023    HGB 10.3 (L) 04/06/2023    HCT 30.8 (L) 04/06/2023    MCV 99.4 (H) 04/06/2023    PLT 152 04/06/2023     Assessment:     Status post Cesarean section. Doing well postoperatively.     Plan:     Discharge home with standard precautions and f/u in office in 1 week for wound vac removal, sooner with any problems.

## 2023-04-11 ENCOUNTER — Encounter: Payer: BLUE CROSS/BLUE SHIELD | Attending: Women's Health

## 2023-04-11 ENCOUNTER — Other Ambulatory Visit: Admit: 2023-04-11 | Discharge: 2023-04-11 | Payer: BLUE CROSS/BLUE SHIELD | Attending: Women's Health

## 2023-04-11 NOTE — Progress Notes (Signed)
The patient returns for her post-partum visit.  All information below was reviewed with her.    Visit Reason:  Post-Partum Visit       Baby's Name: Thurston Hole        Delivery Date:  04/[birth year]       Type of Delivery:  c-section       Feeding: breast       LMP:  07/27/2022       Contraceptive Choices:        Last PAP: never        Depression: 7

## 2023-04-11 NOTE — Patient Instructions (Signed)
After Your Pregnancy: The First 12 Weeks (05:36)  Your health professional recommends that you watch this short online health video.  Find out what happens to your body after giving birth and why it can take time to feel like yourself again.  Purpose:  Explains what happens to the body after labor and delivery. Teaches what is normal recovery and when it may be time to call the doctor or midwife.  Goal:  Learn what happens to the body after labor and delivery. Teaches what is normal recovery and when it may be time to call the doctor or midwife.     How to watch the video    Scan the QR code   OR Visit the website    [email or web address]   Current as of: June 03, 2022               Content Version: 14.0   2006-2024 [employer].   Care instructions adapted under license by [facility]. If you have questions about a medical condition or this instruction, always ask your healthcare professional. [employer] disclaims any warranty or liability for your use of this information.       After Your Delivery (the Postpartum Period): Care Instructions  Overview     After childbirth (postpartum period), your body goes through many changes as you recover. In these weeks after delivery, try to take good care of yourself. Get rest whenever you can and accept help from others.  It may take 4 to 6 weeks to feel like yourself again, and possibly longer if you had a cesarean birth. You may feel sore or very tired as you recover. After delivery, you may continue to have contractions as the uterus returns to the size it was before your pregnancy. You will also have some vaginal bleeding. And you may have pain around the vagina as you heal. Several days after delivery you may also have pain and swelling in your breasts as they fill with milk. There are things you can do at home to help ease these discomforts.  After childbirth, it's common to feel emotional. You may feel irritable, cry  easily, and feel happy one minute and sad the next. This is called the "baby blues." Hormone changes are one cause of these emotional changes. These feelings usually get better within a couple of weeks. If they don't, talk to your doctor or midwife.  In the first couple of weeks after you give birth, your doctor or midwife may want to check in with you and make a plan for follow-up care. You will likely have a complete postpartum visit in the first 3 months after delivery. At that time, your doctor or midwife will check on your recovery and see how you're doing. But if you have questions or concerns before then, you can always call your doctor or midwife.  Follow-up care is a key part of your treatment and safety. Be sure to make and go to all appointments, and call your doctor if you are having problems. It's also a good idea to know your test results and keep a list of the medicines you take.  How can you care for yourself at home?  Taking care of your body  Use pads instead of tampons for bleeding. After birth, you will have bloody vaginal discharge. You may also pass some blood clots that shouldn't be bigger than an egg. Over the next 6 weeks or so, your bleeding should decrease a little every day   and slowly change to a pinkish and then whitish discharge.  For cramps or mild pain, try an over-the-counter pain medicine, such as acetaminophen (Tylenol) or ibuprofen (Advil, Motrin). Read and follow all instructions on the label.  To ease pain around the vagina or from hemorrhoids:  Put ice or a cold pack on the area for 10 to 20 minutes at a time. Put a thin cloth between the ice and your skin.  Try sitting in a few inches of warm water (sitz bath) when you can or after bowel movements.  Clean yourself with a gentle squeeze of warm water from a bottle instead of wiping with toilet paper.  Use witch hazel or hemorrhoid pads (such as Tucks).  Try using a cold compress for sore and swollen breasts. And wear a  supportive bra that fits.  Ease constipation by drinking plenty of fluids and eating high-fiber foods. Ask your doctor or midwife about over-the-counter stool softeners.  Activity  Rest when you can.  Ask for help from family or friends when you need it.  If you can, have another adult in your home for at least 2 or 3 days after birth.  When you feel ready, try to get some exercise every day. For many people, walking is a good choice. Don't do any heavy exercise until your doctor or midwife says it's okay.  Ask your doctor or midwife when it is okay to have vaginal sex.  If you don't want to get pregnant, talk to your doctor or midwife about birth control options. You can get pregnant even before your period returns. Also, you can get pregnant while you are breastfeeding.  Talk to your doctor or midwife if you want to get pregnant again. They can talk to you about when it is safe.  Emotional health  It's normal to have some sadness, anxiety, and mood swings after delivery. It may help to talk with a trusted friend or family member. You can also call the Maternal Mental Health Hotline at 1-833-TLC-MAMA (1-833-852-6262) for support. If these mood changes last more than a couple of weeks, talk to your doctor or midwife.  When should you call for help?  Share this information with your partner, family, or a friend. They can help you watch for warning signs.  Call 911  anytime you think you may need emergency care. For example, call if:    You feel you cannot stop from hurting yourself, your baby, or someone else.     You passed out (lost consciousness).     You have chest pain, are short of breath, or cough up blood.     You have a seizure.   Where to get help 24 hours a day, 7 days a week   If you or someone you know talks about suicide, self-harm, a mental health crisis, a substance use crisis, or any other kind of emotional distress, get help right away. You can:    Call the Suicide and Crisis Lifeline at 988.      Call 1-800-273-TALK (1-800-273-8255).     Text HOME to 741741 to access the Crisis Text Line.   Consider saving these numbers in your phone.  Go to 988lifeline.org for more information or to chat online.  Call your doctor or midwife now or seek immediate medical care if:    You have signs of hemorrhage (too much bleeding), such as:  Heavy vaginal bleeding. This means that you are soaking through one or more pads in   an hour. Or you pass blood clots bigger than an egg.  Feeling dizzy or lightheaded, or you feel like you may faint.  Feeling so tired or weak that you cannot do your usual activities.  A fast or irregular heartbeat.  New or worse belly pain.     You have signs of infection, such as:  A fever.  Increased pain, swelling, warmth, or redness from an incision or wound.  Frequent or painful urination or blood in your urine.  Vaginal discharge that smells bad.  New or worse belly pain.     You have symptoms of a blood clot in your leg (called a deep vein thrombosis), such as:  Pain in the calf, back of the knee, thigh, or groin.  Swelling in the leg or groin.  A color change on the leg or groin. The skin may be reddish or purplish, depending on your usual skin color.     You have signs of preeclampsia, such as:  Sudden swelling of your face, hands, or feet.  New vision problems (such as dimness, blurring, or seeing spots).  A severe headache.     You have signs of heart failure, such as:  New or increased shortness of breath.  New or worse swelling in your legs, ankles, or feet.  Sudden weight gain, such as more than 2 to 3 pounds in a day or 5 pounds in a week.  Feeling so tired or weak that you cannot do your usual activities.     You had spinal or epidural pain relief and have:  New or worse back pain.  Increased pain, swelling, warmth, or redness at the injection site.  Tingling, weakness, or numbness in your legs or groin.   Watch closely for changes in your health, and be sure to contact your doctor or  midwife if:    Your vaginal bleeding isn't decreasing.     You feel sad, anxious, or hopeless for more than a few days.     You are having problems with your breasts or breastfeeding.   Where can you learn more?  Go to https://www.healthwise.net/patientEd and enter A461 to learn more about "After Your Delivery (the Postpartum Period): Care Instructions."  Current as of: February 22, 2022               Content Version: 14.0   2006-2024 [employer].   Care instructions adapted under license by [facility]. If you have questions about a medical condition or this instruction, always ask your healthcare professional. [employer] disclaims any warranty or liability for your use of this information.       Signs of an Emergency After Childbirth: Care Instructions  If you have any of these symptoms after childbirth, it could be a sign of an emergency. Call emergency services or your doctor right away.   Call 911  if you have any of these symptoms.   Thoughts of harming yourself, your baby, or another person.  Passing out.  Chest pain.  Shortness of breath.  Coughing up blood.  A seizure.   Call the Suicide and Crisis Lifeline at 988 or 1-800-273-8255 if you have thoughts of suicide or self-harm.   Call your doctor or midwife now if you have any of these symptoms.    Sudden swelling of your face, hands, or feet.  New vision problems (such as dimness, blurring, or seeing spots).  A severe headache.  Heavy vaginal bleeding (soaking through one or more pads in   an hour, or passing blood clots bigger than an egg).  A fast or irregular heartbeat.  New or worse belly pain.  Feeling so tired or weak that you cannot do your usual activities.  Feeling dizzy or lightheaded.  Pain in your calf, back of the knee, thigh, or groin.  Swelling in your leg or groin.  A color change on the leg or groin.  A fever.  Redness on your breast.  Vaginal discharge that smells bad.  Frequent or painful urination or blood in your  urine.  Feeling sad, anxious, or hopeless for more than a few days.  Write down this information. Share it with emergency services or your doctor.      Date you gave birth:  Date and time your symptoms started:    Symptoms you are having:  Where can you learn more?  Go to https://www.healthwise.net/patientEd and enter E565 to learn more about "Signs of an Emergency After Childbirth: Care Instructions."  Current as of: February 22, 2022               Content Version: 14.0   2006-2024 [employer].   Care instructions adapted under license by [facility]. If you have questions about a medical condition or this instruction, always ask your healthcare professional. [employer] disclaims any warranty or liability for your use of this information.       Postpartum Care at Home With Your Baby: Care Instructions  Overview     After childbirth (postpartum period), your body goes through many changes as you recover. In these weeks after delivery, try to take good care of yourself. Get rest whenever you can and accept help from others.  It may take 4 to 6 weeks to feel like yourself again, and possibly longer if you had a cesarean birth. You may feel sore or very tired as you recover. After delivery, you may continue to have contractions as the uterus returns to the size it was before your pregnancy. You will also have some vaginal bleeding. And you may have pain around the vagina as you heal. Several days after delivery you may also have pain and swelling in your breasts as they fill with milk. There are things you can do at home to help ease these discomforts.  After childbirth, it's common to feel emotional. You may feel irritable, cry easily, and feel happy one minute and sad the next. This is called the "baby blues." Hormone changes are one cause of these emotional changes. These feelings usually get better within a couple of weeks. If they don't, talk to your doctor or midwife.  In the first couple  of weeks after you give birth, your doctor or midwife may want to check in with you and make a plan for follow-up care. You will likely have a complete postpartum visit in the first 3 months after delivery. At that time, your doctor or midwife will check on your recovery and see how you're doing. But if you have questions or concerns before then, you can always call your doctor or midwife.  Follow-up care is a key part of your treatment and safety. Be sure to make and go to all appointments, and call your doctor if you are having problems. It's also a good idea to know your test results and keep a list of the medicines you take.  How can you care for yourself at home?  Taking care of your body  Use pads instead of tampons for bleeding.   After birth, you will have bloody vaginal discharge. You may also pass some blood clots that shouldn't be bigger than an egg. Over the next 6 weeks or so, your bleeding should decrease a little every day and slowly change to a pinkish and then whitish discharge.  For cramps or mild pain, try an over-the-counter pain medicine, such as acetaminophen (Tylenol) or ibuprofen (Advil, Motrin). Read and follow all instructions on the label.  To ease pain around the vagina or from hemorrhoids:  Put ice or a cold pack on the area for 10 to 20 minutes at a time. Put a thin cloth between the ice and your skin.  Try sitting in a few inches of warm water (sitz bath) when you can or after bowel movements.  Clean yourself with a gentle squeeze of warm water from a bottle instead of wiping with toilet paper.  Use witch hazel or hemorrhoid pads (such as Tucks).  Try using a cold compress for sore and swollen breasts. And wear a supportive bra that fits.  Ease constipation by drinking plenty of fluids and eating high-fiber foods. Ask your doctor or midwife about over-the-counter stool softeners.  Activity  Rest when you can.  Ask for help from family or friends when you need it.  If you can, have  another adult in your home for at least 2 or 3 days after birth.  When you feel ready, try to get some exercise every day. For many people, walking is a good choice. Don't do any heavy exercise until your doctor or midwife says it's okay.  Ask your doctor or midwife when it is okay to have vaginal sex.  If you don't want to get pregnant, talk to your doctor or midwife about birth control options. You can get pregnant even before your period returns. Also, you can get pregnant while you are breastfeeding.  Talk to your doctor or midwife if you want to get pregnant again. They can talk to you about when it is safe.  Emotional health  It's normal to have some sadness, anxiety, and mood swings after delivery. It may help to talk with a trusted friend or family member. You can also call the Maternal Mental Health Hotline at 1-833-TLC-MAMA (1-833-852-6262) for support. If these mood changes last more than a couple of weeks, talk to your doctor or midwife.  When should you call for help?  Share this information with your partner, family, or a friend. They can help you watch for warning signs.  Call 911  anytime you think you may need emergency care. For example, call if:    You feel you cannot stop from hurting yourself, your baby, or someone else.     You passed out (lost consciousness).     You have chest pain, are short of breath, or cough up blood.     You have a seizure.   Where to get help 24 hours a day, 7 days a week   If you or someone you know talks about suicide, self-harm, a mental health crisis, a substance use crisis, or any other kind of emotional distress, get help right away. You can:    Call the Suicide and Crisis Lifeline at 988.     Call 1-800-273-TALK (1-800-273-8255).     Text HOME to 741741 to access the Crisis Text Line.   Consider saving these numbers in your phone.  Go to 988lifeline.org for more information or to chat online.  Call your doctor or midwife   now or seek immediate medical care if:     You have signs of hemorrhage (too much bleeding), such as:  Heavy vaginal bleeding. This means that you are soaking through one or more pads in an hour. Or you pass blood clots bigger than an egg.  Feeling dizzy or lightheaded, or you feel like you may faint.  Feeling so tired or weak that you cannot do your usual activities.  A fast or irregular heartbeat.  New or worse belly pain.     You have signs of infection, such as:  A fever.  Increased pain, swelling, warmth, or redness from an incision or wound.  Frequent or painful urination or blood in your urine.  Vaginal discharge that smells bad.  New or worse belly pain.     You have symptoms of a blood clot in your leg (called a deep vein thrombosis), such as:  Pain in the calf, back of the knee, thigh, or groin.  Swelling in the leg or groin.  A color change on the leg or groin. The skin may be reddish or purplish, depending on your usual skin color.     You have signs of preeclampsia, such as:  Sudden swelling of your face, hands, or feet.  New vision problems (such as dimness, blurring, or seeing spots).  A severe headache.     You have signs of heart failure, such as:  New or increased shortness of breath.  New or worse swelling in your legs, ankles, or feet.  Sudden weight gain, such as more than 2 to 3 pounds in a day or 5 pounds in a week.  Feeling so tired or weak that you cannot do your usual activities.     You had spinal or epidural pain relief and have:  New or worse back pain.  Increased pain, swelling, warmth, or redness at the injection site.  Tingling, weakness, or numbness in your legs or groin.   Watch closely for changes in your health, and be sure to contact your doctor or midwife if:    Your vaginal bleeding isn't decreasing.     You feel sad, anxious, or hopeless for more than a few days.     You are having problems with your breasts or breastfeeding.   Where can you learn more?  Go to https://www.healthwise.net/patientEd and enter Z768 to  learn more about "Postpartum Care at Home With Your Baby: Care Instructions."  Current as of: February 22, 2022               Content Version: 14.0   2006-2024 [employer], [employer].   Care instructions adapted under license by [facility]. If you have questions about a medical condition or this instruction, always ask your healthcare professional. [employer], Inc[employer] disclaims any warranty or liability for your use of this information.       Depression After Childbirth: Care Instructions  It's common to lose sleep, feel irritable, and cry easily during the first few days after childbirth. Hormone changes and the demands of a new baby can cause these "baby blues." If these mood changes last more than 2 weeks, you may have postpartum depression. This is a medical condition that requires treatment.  If you have any of these signs, you may be depressed. See your doctor right away.    You feel very sad or hopeless and lose interest in daily activities.       You sleep too much or not enough.         You feel tired or as if you have no energy.       You eat too much or too little.       You write or talk about death.     Tips to help with postpartum depression    What you can do    Try to go to all of your counseling sessions.  Take medicines as directed.  Eat healthy foods.  Get daily exercise, such as walks.  Try to get some sunlight every day.  Avoid using alcohol or other substances.  Get as much rest as possible.  Connect with friends, and join a support group for new parents.  When should you call for help?   Call 911 if:    You feel you cannot stop from hurting yourself, your baby, or someone else.   Where to get help 24 hours a day, 7 days a week   If you or someone you know talks about suicide, self-harm, a mental health crisis, a substance use crisis, or any other kind of emotional distress, get help right away. You can:    Call the Suicide and Crisis Lifeline at 988.     Call 1-800-273-TALK  (1-800-273-8255).     Text HOME to 741741 to access the Crisis Text Line.   Consider saving these numbers in your phone.  Go to 988lifeline.org for more information or to chat online.  Call your doctor now or seek immediate medical care if:    You are having trouble caring for yourself or your baby.     You hear voices.   Contact your doctor if:    You have problems with your medicines.     You do not get better as expected.   Follow-up care is a key part of your treatment and safety. Be sure to make and go to all appointments, and call your doctor if you are having problems. It's also a good idea to know your test results and keep a list of the medicines you take.  Where can you learn more?  Go to https://www.healthwise.net/patientEd and enter Y765 to learn more about "Depression After Childbirth: Care Instructions."  Current as of: February 06, 2022               Content Version: 14.0   2006-2024 [employer].   Care instructions adapted under license by [facility]. If you have questions about a medical condition or this instruction, always ask your healthcare professional. [employer] disclaims any warranty or liability for your use of this information.        Understanding Postpartum Depression (02:47)  Your health professional recommends that you watch this short online health video.  Learn about postpartum depression and how it's treated.  Purpose:  Explains how to recognize and treat postpartum depression.  Goal:  User will learn about postpartum depression and how it can be treated.     How to watch the video    Scan the QR code   OR Visit the website    [email or web address]   Current as of: February 06, 2022               Content Version: 14.0   2006-2024 [employer].   Care instructions adapted under license by [facility]. If you have questions about a medical condition or this instruction, always ask your healthcare professional. [employer]  disclaims any warranty or liability for your use of this information.         Learning About Birth Control After Childbirth  What is birth control?  Birth control is any method used to prevent pregnancy. If you have vaginal sex without birth control, you could get pregnant--even if you haven't started having periods again. You're less likely to get pregnant while breastfeeding, but it's still possible. Finding birth control that works for you can help avoid an unplanned pregnancy.  There are many kinds of birth control. Each has pros and cons. Find what works for you. Talk to your doctor if you've just given birth or are breastfeeding.    Long-acting reversible contraception (LARC). These are placed inside your body by a doctor. They can prevent pregnancy for years.  Examples include:  An implant (hormonal).  Copper intrauterine device (IUD).  Hormonal IUDs.    Short-acting hormonal methods. These release hormones. Examples include:  Combination birth control pills ("the pill").  Skin patches.  A vaginal ring.  A shot.  Mini-pills. Choose progestin-only options soon after giving birth.    Barrier methods. Use these every time you have vaginal sex.  Examples include:  External (female) condoms.  Internal (female) condoms.  Diaphragms.  Cervical caps.  Sponges.    Spermicides. These kill sperm or stop sperm from moving. They can be gels, creams, foams, films, or tablets. Use them before vaginal sex.  Examples include:  Nonoxynol-9.  pH regulator gel.    Permanent birth control (sterilization). This can be an option if you're sure that you don't want to get pregnant later.  Examples include:  Vasectomy.  Having tubes tied (tubal ligation).    Emergency contraception. This is a backup method. Use it if you didn't use birth control or your birth control method failed.  Examples include:  Copper and hormonal IUDs.  Emergency contraceptive pills.    Fertility awareness. You'll learn when you are most likely to become pregnant  (are fertile). You can avoid vaginal sex at that time.  It's also called:  Natural family planning.  The rhythm method.    Breastfeeding. This is most effective when all of these are true:  Your baby is younger than 6 months old.  You're breastfeeding and not bottle-feeding at all.  You aren't having periods.  Follow-up care is a key part of your treatment and safety. Be sure to make and go to all appointments, and call your doctor if you are having problems. It's also a good idea to know your test results and keep a list of the medicines you take.  Where can you learn more?  Go to https://www.healthwise.net/patientEd and enter X408 to learn more about "Learning About Birth Control After Childbirth."  Current as of: February 22, 2022               Content Version: 14.0   2006-2024 [employer].   Care instructions adapted under license by [facility]. If you have questions about a medical condition or this instruction, always ask your healthcare professional. [employer] disclaims any warranty or liability for your use of this information.       Nutrition for Breastfeeding: Care Instructions  Overview     Eating well during breastfeeding helps you stay healthy. Eat a variety of grains, vegetables, fruits, dairy or dairy alternatives, and protein foods. Avoid fish high in mercury. And limit alcohol and caffeine. Your doctor or midwife may suggest eating more calories each day than otherwise recommended for a person of your height and weight.  Follow-up care is a key part of your treatment   and safety. Be sure to make and go to all appointments, and call your doctor if you are having problems. It's also a good idea to know your test results and keep a list of the medicines you take.  How can you care for yourself at home?  Making good choices about what you eat and drink when you are breastfeeding can help you stay healthy. It can also help your baby stay healthy. Here are some things you can  do.  Eat a variety of healthy foods.  This includes vegetables, fruits, milk products, whole grains, and protein.  Drink plenty of fluids.  Make water your first choice. If you have kidney, heart, or liver disease and have to limit fluids, talk with your doctor before you increase your fluids.  Avoid fish with high mercury.  This includes shark, swordfish, king mackerel, and marlin. It also includes orange roughy, bigeye tuna, and tilefish from the [place]. Instead, eat fish that is low in mercury. Choose canned light tuna, salmon, pollock, catfish, or shrimp.   Limit caffeine.  Things like coffee, tea, chocolate, and some sodas can contain caffeine. Caffeine can pass through breast milk to your baby. It may cause fussiness and sleep problems. Talk to your doctor about how much caffeine is safe for you.  Limit alcohol.  Alcohol can pass through breast milk to your baby. Talk to your doctor if you have questions about drinking alcohol while breastfeeding.  Be safe with supplements.  Talk with your doctor before taking any vitamins, minerals, and herbal or other dietary supplements.   When should you call for help?  Watch closely for changes in your health, and be sure to contact your doctor if you have any problems.  Where can you learn more?  Go to https://www.healthwise.net/patientEd and enter P234 to learn more about "Nutrition for Breastfeeding: Care Instructions."  Current as of: May 05, 2022               Content Version: 14.0   2006-2024 [employer].   Care instructions adapted under license by [facility]. If you have questions about a medical condition or this instruction, always ask your healthcare professional. [employer] disclaims any warranty or liability for your use of this information.          Caring for Yourself After Cesarean Delivery (03:27)  Your health professional recommends that you watch this short online health video.  Learn ways to care for yourself  after giving birth.  Purpose:  Teaches postpartum care after cesarean delivery.  Goal:  Users will learn how to take good care of themselves after having a baby.     How to watch the video    Scan the QR code   OR Visit the website    https://hwi.se/r/[email or web address]   Current as of: February 22, 2022               Content Version: 14.0   2006-2024 [employer].   Care instructions adapted under license by [facility]. If you have questions about a medical condition or this instruction, always ask your healthcare professional. [employer] Inc[employer] disclaims any warranty or liability for your use of this information.

## 2023-04-11 NOTE — Progress Notes (Signed)
[facility]  CNM Office Note    Krystal Bird is a 27 y.o. female who presents today for her medical conditions/ complaints as noted below.  Chief Complaint   Patient presents with    Postpartum Care       HPI  Krystal Bird presents for her 1 week post op visit. Her mood is good. Minimal bleeding. Baby graduating and going home from NICU today.          Visit Reason:  Post-Partum Visit       Baby's Name: Krystal Bird        Delivery Date:  04/05/23       Type of Delivery:  c-section       Feeding: breast       LMP:  07/27/2022       Contraceptive Choices:        Last PAP: never        Depression: 7    Problems/Complaints today:  Patient Active Problem List   Diagnosis    Spells of decreased attentiveness    Anxiety    Other headache syndrome    Syncope, near    Dizzy spells    [redacted] weeks gestation of pregnancy    Fetal distress affecting care       Patient's last menstrual period was 07/27/2022 (exact date).  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 No pertinent surgical history.  History reviewed. No pertinent family history.  Social History     Tobacco Use    Smoking status: Never    Smokeless tobacco: Not on file   Substance Use Topics    Alcohol use: No       Current Outpatient Medications   Medication Sig Dispense Refill    sertraline (ZOLOFT) 50 MG tablet Take 1 tablet by mouth in the morning and at bedtime 60 tablet 5    Prenat-B2-B6-B12-D3-FA (PRENA1 PO) Take by mouth      oxyCODONE (ROXICODONE) 5 MG immediate release tablet Take 1 tablet by mouth every 4 hours as needed for Pain for up to 3 days. Max Daily Amount: 30 mg (Patient not taking: Reported on 04/11/2023) 18 tablet 0    ibuprofen (ADVIL;MOTRIN) 800 MG tablet Take 1 tablet by mouth every 8 hours as needed for Pain (Patient not taking: Reported on 04/11/2023) 30 tablet 3    docusate sodium (COLACE, DULCOLAX) 100 MG CAPS Take 100 mg by mouth 2 times daily  (Patient not taking: Reported on 04/11/2023) 30 capsule 0     No current facility-administered medications for this visit.     No Known Allergies  Vitals:    04/11/23 1059   BP: 127/80   Pulse: 56   Weight: 102.1 kg (225 lb)   Height: 1.676 m (5\' 6" )     Body mass index is 36.32 kg/m.    A comprehensive review of systems was negative except for what was noted in the HPI.     Physical Exam  Constitutional:       Appearance: Normal appearance. She is well-developed and well-groomed.   HENT:      Head: Normocephalic and atraumatic.      Right Ear: Hearing normal.      Nose: Nose normal.      Mouth/Throat:      Lips: Pink.   Eyes:      Conjunctiva/sclera:  Conjunctivae normal.      Pupils: Pupils are equal, round, and reactive to light.   Abdominal:          Comments: Incision clean, dry and intact   Musculoskeletal:      Cervical back: Normal range of motion and neck supple.   Neurological:      General: No focal deficit present.      Mental Status: She is alert and oriented to person, place, and time.   Skin:     General: Skin is warm and dry.   Psychiatric:         Attention and Perception: Attention and perception normal.         Mood and Affect: Mood and affect normal.         Speech: Speech normal.         Behavior: Behavior normal. Behavior is cooperative.         Thought Content: Thought content normal.          MEDICATIONS:  No orders of the defined types were placed in this encounter.      ORDERS:  No orders of the defined types were placed in this encounter.      PLAN:  1. Postpartum care and examination  2. S/P C-section  3. Encounter for screening for maternal depression        I Edison Simon, LPN, am scribing for and in the presence of Antony Salmon, [place]  04/11/23  11:20 AM CDT   I have seen and examined the patient independently.  I reviewed all laboratory and imaging studies that are relevant.  I have reviewed and made any appropriate changes to the HPI.    Electronically signed by Antony Salmon,  APRN - CNM on 04/11/23  at 2:36 PM CDT

## 2023-04-14 ENCOUNTER — Encounter: Payer: BLUE CROSS/BLUE SHIELD | Attending: Women's Health

## 2023-04-16 NOTE — Care Coordination (Signed)
 Social Worker post partum screening:SW Luke made phone contact with the Pt  first attempt phone call to complete the Pt Post partum screening on 04/16/23 at 12:12 PM. Pt was able to answer all questions appropriate and declined needing any community resources at this time. Pt reported she is pumping her milk currently with no reported issues with baby feeds at this time. Pt reported appointment scheduled on 05/23/23 at 9:45 AM with her OB/GYN. Pt currently denies having any Sim HI, or any self harming behaviors at this time. Pt currently denies having any Si, HI, or any self harming behaviors at this time. Electronically signed by Payten Hobin on 04/16/2023 at 12:59 PM.

## 2023-04-16 NOTE — Care Coordination-Inpatient (Signed)
 Social Worker post partum screening: SW Kim made phone contact with the Pt  first attempt phone call to complete the Pt Post partum screening on 10/01/22 at 11:18 AM. Pt was able to answer all questions appropriate and declined needing any community resources at this time. Pt reported she is pumping her milk currently, but her baby isn't eating right now due to medical issues and under medical care with Physician at this time. Pt reported she has a follow up appointment on 10/07/22 at 3:00 PM with her OB/GYN.   Electronically signed by Olando Willems on 02/21/2024 at 7:48 AM

## 2023-04-19 NOTE — Telephone Encounter (Signed)
 Spoke to patient and made follow up appointment per patient request. Patient states she presented to [facility]  on 8/28 for increase fever and incisional pain, and bilateral leg pain. Patient stated Jerrye tried to have patient transferred to [facility] and were told that they couldn't accept her due to having an active infection as well as stating they had no records of patient having been in our office. Patient to transferred to [facility] and admitted. Patient was treated with IV antibiotics while admitted, and sent home on oral antibiotics. [place] records downloaded via Care Everywhere

## 2023-04-20 ENCOUNTER — Ambulatory Visit: Admit: 2023-04-20 | Discharge: 2023-04-20 | Payer: BLUE CROSS/BLUE SHIELD | Attending: Women's Health

## 2023-04-20 NOTE — Patient Instructions (Signed)
 After Your Pregnancy: The First 12 Weeks (05:36)  Your health professional recommends that you watch this short online health video.  Find out what happens to your body after giving birth and why it can take time to feel like yourself again.  Purpose:  Explains what happens to the body after labor and delivery. Teaches what is normal recovery and when it may be time to call the doctor or midwife.  Goal:  Learn what happens to the body after labor and delivery. Teaches what is normal recovery and when it may be time to call the doctor or midwife.     How to watch the video    Scan the QR code   OR Visit the website    [email or web address]   Current as of: June 03, 2022               Content Version: 14.0   2006-2024 [employer].   Care instructions adapted under license by [facility]. If you have questions about a medical condition or this instruction, always ask your healthcare professional. [employer] disclaims any warranty or liability for your use of this information.       After Your Delivery (the Postpartum Period): Care Instructions  Overview     After childbirth (postpartum period), your body goes through many changes as you recover. In these weeks after delivery, try to take good care of yourself. Get rest whenever you can and accept help from others.  It may take 4 to 6 weeks to feel like yourself again, and possibly longer if you had a cesarean birth. You may feel sore or very tired as you recover. After delivery, you may continue to have contractions as the uterus returns to the size it was before your pregnancy. You will also have some vaginal bleeding. And you may have pain around the vagina as you heal. Several days after delivery you may also have pain and swelling in your breasts as they fill with milk. There are things you can do at home to help ease these discomforts.  After childbirth, it's common to feel emotional. You may feel irritable, cry  easily, and feel happy one minute and sad the next. This is called the "baby blues." Hormone changes are one cause of these emotional changes. These feelings usually get better within a couple of weeks. If they don't, talk to your doctor or midwife.  In the first couple of weeks after you give birth, your doctor or midwife may want to check in with you and make a plan for follow-up care. You will likely have a complete postpartum visit in the first 3 months after delivery. At that time, your doctor or midwife will check on your recovery and see how you're doing. But if you have questions or concerns before then, you can always call your doctor or midwife.  Follow-up care is a key part of your treatment and safety. Be sure to make and go to all appointments, and call your doctor if you are having problems. It's also a good idea to know your test results and keep a list of the medicines you take.  How can you care for yourself at home?  Taking care of your body  Use pads instead of tampons for bleeding. After birth, you will have bloody vaginal discharge. You may also pass some blood clots that shouldn't be bigger than an egg. Over the next 6 weeks or so, your bleeding should decrease a little every day  and slowly change to a pinkish and then whitish discharge.  For cramps or mild pain, try an over-the-counter pain medicine, such as acetaminophen (Tylenol) or ibuprofen (Advil, Motrin). Read and follow all instructions on the label.  To ease pain around the vagina or from hemorrhoids:  Put ice or a cold pack on the area for 10 to 20 minutes at a time. Put a thin cloth between the ice and your skin.  Try sitting in a few inches of warm water (sitz bath) when you can or after bowel movements.  Clean yourself with a gentle squeeze of warm water from a bottle instead of wiping with toilet paper.  Use witch hazel or hemorrhoid pads (such as Tucks).  Try using a cold compress for sore and swollen breasts. And wear a  supportive bra that fits.  Ease constipation by drinking plenty of fluids and eating high-fiber foods. Ask your doctor or midwife about over-the-counter stool softeners.  Activity  Rest when you can.  Ask for help from family or friends when you need it.  If you can, have another adult in your home for at least 2 or 3 days after birth.  When you feel ready, try to get some exercise every day. For many people, walking is a good choice. Don't do any heavy exercise until your doctor or midwife says it's okay.  Ask your doctor or midwife when it is okay to have vaginal sex.  If you don't want to get pregnant, talk to your doctor or midwife about birth control options. You can get pregnant even before your period returns. Also, you can get pregnant while you are breastfeeding.  Talk to your doctor or midwife if you want to get pregnant again. They can talk to you about when it is safe.  Emotional health  It's normal to have some sadness, anxiety, and mood swings after delivery. It may help to talk with a trusted friend or family member. You can also call the Maternal Mental Health Hotline at 1-833-TLC-MAMA ((443)215-7429) for support. If these mood changes last more than a couple of weeks, talk to your doctor or midwife.  When should you call for help?  Share this information with your partner, family, or a friend. They can help you watch for warning signs.  Call 911  anytime you think you may need emergency care. For example, call if:    You feel you cannot stop from hurting yourself, your baby, or someone else.     You passed out (lost consciousness).     You have chest pain, are short of breath, or cough up blood.     You have a seizure.   Where to get help 24 hours a day, 7 days a week   If you or someone you know talks about suicide, self-harm, a mental health crisis, a substance use crisis, or any other kind of emotional distress, get help right away. You can:    Call the Suicide and Crisis Lifeline at 81.      Call 1-800-273-TALK (857-395-9630).     Text HOME to (959)414-0709 to access the Crisis Text Line.   Consider saving these numbers in your phone.  Go to 988lifeline.org for more information or to chat online.  Call your doctor or midwife now or seek immediate medical care if:    You have signs of hemorrhage (too much bleeding), such as:  Heavy vaginal bleeding. This means that you are soaking through one or more pads in  an hour. Or you pass blood clots bigger than an egg.  Feeling dizzy or lightheaded, or you feel like you may faint.  Feeling so tired or weak that you cannot do your usual activities.  A fast or irregular heartbeat.  New or worse belly pain.     You have signs of infection, such as:  A fever.  Increased pain, swelling, warmth, or redness from an incision or wound.  Frequent or painful urination or blood in your urine.  Vaginal discharge that smells bad.  New or worse belly pain.     You have symptoms of a blood clot in your leg (called a deep vein thrombosis), such as:  Pain in the calf, back of the knee, thigh, or groin.  Swelling in the leg or groin.  A color change on the leg or groin. The skin may be reddish or purplish, depending on your usual skin color.     You have signs of preeclampsia, such as:  Sudden swelling of your face, hands, or feet.  New vision problems (such as dimness, blurring, or seeing spots).  A severe headache.     You have signs of heart failure, such as:  New or increased shortness of breath.  New or worse swelling in your legs, ankles, or feet.  Sudden weight gain, such as more than 2 to 3 pounds in a day or 5 pounds in a week.  Feeling so tired or weak that you cannot do your usual activities.     You had spinal or epidural pain relief and have:  New or worse back pain.  Increased pain, swelling, warmth, or redness at the injection site.  Tingling, weakness, or numbness in your legs or groin.   Watch closely for changes in your health, and be sure to contact your doctor or  midwife if:    Your vaginal bleeding isn't decreasing.     You feel sad, anxious, or hopeless for more than a few days.     You are having problems with your breasts or breastfeeding.   Where can you learn more?  Go to [email or web address] and enter A461 to learn more about "After Your Delivery (the Postpartum Period): Care Instructions."  Current as of: February 22, 2022               Content Version: 14.0   2006-2024 [employer].   Care instructions adapted under license by [facility]. If you have questions about a medical condition or this instruction, always ask your healthcare professional. [employer] disclaims any warranty or liability for your use of this information.       Signs of an Emergency After Childbirth: Care Instructions  If you have any of these symptoms after childbirth, it could be a sign of an emergency. Call emergency services or your doctor right away.   Call 911  if you have any of these symptoms.   Thoughts of harming yourself, your baby, or another person.  Passing out.  Chest pain.  Shortness of breath.  Coughing up blood.  A seizure.   Call the Suicide and Crisis Lifeline at 43 or (608)839-0368 if you have thoughts of suicide or self-harm.   Call your doctor or midwife now if you have any of these symptoms.    Sudden swelling of your face, hands, or feet.  New vision problems (such as dimness, blurring, or seeing spots).  A severe headache.  Heavy vaginal bleeding (soaking through one or more pads in  an hour, or passing blood clots bigger than an egg).  A fast or irregular heartbeat.  New or worse belly pain.  Feeling so tired or weak that you cannot do your usual activities.  Feeling dizzy or lightheaded.  Pain in your calf, back of the knee, thigh, or groin.  Swelling in your leg or groin.  A color change on the leg or groin.  A fever.  Redness on your breast.  Vaginal discharge that smells bad.  Frequent or painful urination or blood in your  urine.  Feeling sad, anxious, or hopeless for more than a few days.  Write down this information. Share it with emergency services or your doctor.      Date you gave birth:  Date and time your symptoms started:    Symptoms you are having:  Where can you learn more?  Go to [email or web address] and enter E565 to learn more about "Signs of an Emergency After Childbirth: Care Instructions."  Current as of: February 22, 2022               Content Version: 14.0   2006-2024 [employer].   Care instructions adapted under license by [facility]. If you have questions about a medical condition or this instruction, always ask your healthcare professional. [employer] disclaims any warranty or liability for your use of this information.       Postpartum Care at Home With Your Baby: Care Instructions  Overview     After childbirth (postpartum period), your body goes through many changes as you recover. In these weeks after delivery, try to take good care of yourself. Get rest whenever you can and accept help from others.  It may take 4 to 6 weeks to feel like yourself again, and possibly longer if you had a cesarean birth. You may feel sore or very tired as you recover. After delivery, you may continue to have contractions as the uterus returns to the size it was before your pregnancy. You will also have some vaginal bleeding. And you may have pain around the vagina as you heal. Several days after delivery you may also have pain and swelling in your breasts as they fill with milk. There are things you can do at home to help ease these discomforts.  After childbirth, it's common to feel emotional. You may feel irritable, cry easily, and feel happy one minute and sad the next. This is called the "baby blues." Hormone changes are one cause of these emotional changes. These feelings usually get better within a couple of weeks. If they don't, talk to your doctor or midwife.  In the first couple  of weeks after you give birth, your doctor or midwife may want to check in with you and make a plan for follow-up care. You will likely have a complete postpartum visit in the first 3 months after delivery. At that time, your doctor or midwife will check on your recovery and see how you're doing. But if you have questions or concerns before then, you can always call your doctor or midwife.  Follow-up care is a key part of your treatment and safety. Be sure to make and go to all appointments, and call your doctor if you are having problems. It's also a good idea to know your test results and keep a list of the medicines you take.  How can you care for yourself at home?  Taking care of your body  Use pads instead of tampons for bleeding.  After birth, you will have bloody vaginal discharge. You may also pass some blood clots that shouldn't be bigger than an egg. Over the next 6 weeks or so, your bleeding should decrease a little every day and slowly change to a pinkish and then whitish discharge.  For cramps or mild pain, try an over-the-counter pain medicine, such as acetaminophen (Tylenol) or ibuprofen (Advil, Motrin). Read and follow all instructions on the label.  To ease pain around the vagina or from hemorrhoids:  Put ice or a cold pack on the area for 10 to 20 minutes at a time. Put a thin cloth between the ice and your skin.  Try sitting in a few inches of warm water (sitz bath) when you can or after bowel movements.  Clean yourself with a gentle squeeze of warm water from a bottle instead of wiping with toilet paper.  Use witch hazel or hemorrhoid pads (such as Tucks).  Try using a cold compress for sore and swollen breasts. And wear a supportive bra that fits.  Ease constipation by drinking plenty of fluids and eating high-fiber foods. Ask your doctor or midwife about over-the-counter stool softeners.  Activity  Rest when you can.  Ask for help from family or friends when you need it.  If you can, have  another adult in your home for at least 2 or 3 days after birth.  When you feel ready, try to get some exercise every day. For many people, walking is a good choice. Don't do any heavy exercise until your doctor or midwife says it's okay.  Ask your doctor or midwife when it is okay to have vaginal sex.  If you don't want to get pregnant, talk to your doctor or midwife about birth control options. You can get pregnant even before your period returns. Also, you can get pregnant while you are breastfeeding.  Talk to your doctor or midwife if you want to get pregnant again. They can talk to you about when it is safe.  Emotional health  It's normal to have some sadness, anxiety, and mood swings after delivery. It may help to talk with a trusted friend or family member. You can also call the Maternal Mental Health Hotline at 1-833-TLC-MAMA (848-451-5540) for support. If these mood changes last more than a couple of weeks, talk to your doctor or midwife.  When should you call for help?  Share this information with your partner, family, or a friend. They can help you watch for warning signs.  Call 911  anytime you think you may need emergency care. For example, call if:    You feel you cannot stop from hurting yourself, your baby, or someone else.     You passed out (lost consciousness).     You have chest pain, are short of breath, or cough up blood.     You have a seizure.   Where to get help 24 hours a day, 7 days a week   If you or someone you know talks about suicide, self-harm, a mental health crisis, a substance use crisis, or any other kind of emotional distress, get help right away. You can:    Call the Suicide and Crisis Lifeline at 63.     Call 1-800-273-TALK (437 718 2434).     Text HOME to 929-403-4008 to access the Crisis Text Line.   Consider saving these numbers in your phone.  Go to 988lifeline.org for more information or to chat online.  Call your doctor or midwife  now or seek immediate medical care if:     You have signs of hemorrhage (too much bleeding), such as:  Heavy vaginal bleeding. This means that you are soaking through one or more pads in an hour. Or you pass blood clots bigger than an egg.  Feeling dizzy or lightheaded, or you feel like you may faint.  Feeling so tired or weak that you cannot do your usual activities.  A fast or irregular heartbeat.  New or worse belly pain.     You have signs of infection, such as:  A fever.  Increased pain, swelling, warmth, or redness from an incision or wound.  Frequent or painful urination or blood in your urine.  Vaginal discharge that smells bad.  New or worse belly pain.     You have symptoms of a blood clot in your leg (called a deep vein thrombosis), such as:  Pain in the calf, back of the knee, thigh, or groin.  Swelling in the leg or groin.  A color change on the leg or groin. The skin may be reddish or purplish, depending on your usual skin color.     You have signs of preeclampsia, such as:  Sudden swelling of your face, hands, or feet.  New vision problems (such as dimness, blurring, or seeing spots).  A severe headache.     You have signs of heart failure, such as:  New or increased shortness of breath.  New or worse swelling in your legs, ankles, or feet.  Sudden weight gain, such as more than 2 to 3 pounds in a day or 5 pounds in a week.  Feeling so tired or weak that you cannot do your usual activities.     You had spinal or epidural pain relief and have:  New or worse back pain.  Increased pain, swelling, warmth, or redness at the injection site.  Tingling, weakness, or numbness in your legs or groin.   Watch closely for changes in your health, and be sure to contact your doctor or midwife if:    Your vaginal bleeding isn't decreasing.     You feel sad, anxious, or hopeless for more than a few days.     You are having problems with your breasts or breastfeeding.   Where can you learn more?  Go to [employer] and enter Z768 to  learn more about "Postpartum Care at Home With Your Baby: Care Instructions."  Current as of: February 22, 2022               Content Version: 14.0   2006-2024 [employer], [employer].   Care instructions adapted under license by [facility]. If you have questions about a medical condition or this instruction, always ask your healthcare professional. [employer], Inc[employer] disclaims any warranty or liability for your use of this information.       Depression After Childbirth: Care Instructions  It's common to lose sleep, feel irritable, and cry easily during the first few days after childbirth. Hormone changes and the demands of a new baby can cause these "baby blues." If these mood changes last more than 2 weeks, you may have postpartum depression. This is a medical condition that requires treatment.  If you have any of these signs, you may be depressed. See your doctor right away.    You feel very sad or hopeless and lose interest in daily activities.       You sleep too much or not enough.  You feel tired or as if you have no energy.       You eat too much or too little.       You write or talk about death.     Tips to help with postpartum depression    What you can do    Try to go to all of your counseling sessions.  Take medicines as directed.  Eat healthy foods.  Get daily exercise, such as walks.  Try to get some sunlight every day.  Avoid using alcohol or other substances.  Get as much rest as possible.  Connect with friends, and join a support group for new parents.  When should you call for help?   Call 911 if:    You feel you cannot stop from hurting yourself, your baby, or someone else.   Where to get help 24 hours a day, 7 days a week   If you or someone you know talks about suicide, self-harm, a mental health crisis, a substance use crisis, or any other kind of emotional distress, get help right away. You can:    Call the Suicide and Crisis Lifeline at 33.     Call 1-800-273-TALK  ((660)447-2760).     Text HOME to 339-227-0682 to access the Crisis Text Line.   Consider saving these numbers in your phone.  Go to 988lifeline.org for more information or to chat online.  Call your doctor now or seek immediate medical care if:    You are having trouble caring for yourself or your baby.     You hear voices.   Contact your doctor if:    You have problems with your medicines.     You do not get better as expected.   Follow-up care is a key part of your treatment and safety. Be sure to make and go to all appointments, and call your doctor if you are having problems. It's also a good idea to know your test results and keep a list of the medicines you take.  Where can you learn more?  Go to [employer] and enter Y782 to learn more about "Depression After Childbirth: Care Instructions."  Current as of: February 06, 2022               Content Version: 14.0   2006-2024 [employer].   Care instructions adapted under license by [facility]. If you have questions about a medical condition or this instruction, always ask your healthcare professional. [employer] disclaims any warranty or liability for your use of this information.        Understanding Postpartum Depression (02:47)  Your health professional recommends that you watch this short online health video.  Learn about postpartum depression and how it's treated.  Purpose:  Explains how to recognize and treat postpartum depression.  Goal:  User will learn about postpartum depression and how it can be treated.     How to watch the video    Scan the QR code   OR Visit the website    [email or web address]   Current as of: February 06, 2022               Content Version: 14.0   2006-2024 [employer].   Care instructions adapted under license by [facility]. If you have questions about a medical condition or this instruction, always ask your healthcare professional. [employer]  disclaims any warranty or liability for your use of this information.  Learning About Birth Control After Childbirth  What is birth control?  Birth control is any method used to prevent pregnancy. If you have vaginal sex without birth control, you could get pregnant--even if you haven't started having periods again. You're less likely to get pregnant while breastfeeding, but it's still possible. Finding birth control that works for you can help avoid an unplanned pregnancy.  There are many kinds of birth control. Each has pros and cons. Find what works for you. Talk to your doctor if you've just given birth or are breastfeeding.    Long-acting reversible contraception (LARC). These are placed inside your body by a doctor. They can prevent pregnancy for years.  Examples include:  An implant (hormonal).  Copper intrauterine device (IUD).  Hormonal IUDs.    Short-acting hormonal methods. These release hormones. Examples include:  Combination birth control pills ("the pill").  Skin patches.  A vaginal ring.  A shot.  Mini-pills. Choose progestin-only options soon after giving birth.    Barrier methods. Use these every time you have vaginal sex.  Examples include:  External (female) condoms.  Internal (female) condoms.  Diaphragms.  Cervical caps.  Sponges.    Spermicides. These kill sperm or stop sperm from moving. They can be gels, creams, foams, films, or tablets. Use them before vaginal sex.  Examples include:  Nonoxynol-9.  pH regulator gel.    Permanent birth control (sterilization). This can be an option if you're sure that you don't want to get pregnant later.  Examples include:  Vasectomy.  Having tubes tied (tubal ligation).    Emergency contraception. This is a backup method. Use it if you didn't use birth control or your birth control method failed.  Examples include:  Copper and hormonal IUDs.  Emergency contraceptive pills.    Fertility awareness. You'll learn when you are most likely to become pregnant  (are fertile). You can avoid vaginal sex at that time.  It's also called:  Natural family planning.  The rhythm method.    Breastfeeding. This is most effective when all of these are true:  Your baby is younger than 52 months old.  You're breastfeeding and not bottle-feeding at all.  You aren't having periods.  Follow-up care is a key part of your treatment and safety. Be sure to make and go to all appointments, and call your doctor if you are having problems. It's also a good idea to know your test results and keep a list of the medicines you take.  Where can you learn more?  Go to [employer] and enter X408 to learn more about "Learning About Birth Control After Childbirth."  Current as of: February 22, 2022               Content Version: 14.0   2006-2024 [employer].   Care instructions adapted under license by [facility]. If you have questions about a medical condition or this instruction, always ask your healthcare professional. [employer] Inc[employer] disclaims any warranty or liability for your use of this information.       Nutrition for Breastfeeding: Care Instructions  Overview     Eating well during breastfeeding helps you stay healthy. Eat a variety of grains, vegetables, fruits, dairy or dairy alternatives, and protein foods. Avoid fish high in mercury. And limit alcohol and caffeine. Your doctor or midwife may suggest eating more calories each day than otherwise recommended for a person of your height and weight.  Follow-up care is a key part of your treatment  and safety. Be sure to make and go to all appointments, and call your doctor if you are having problems. It's also a good idea to know your test results and keep a list of the medicines you take.  How can you care for yourself at home?  Making good choices about what you eat and drink when you are breastfeeding can help you stay healthy. It can also help your baby stay healthy. Here are some things you can  do.  Eat a variety of healthy foods.  This includes vegetables, fruits, milk products, whole grains, and protein.  Drink plenty of fluids.  Make water your first choice. If you have kidney, heart, or liver disease and have to limit fluids, talk with your doctor before you increase your fluids.  Avoid fish with high mercury.  This includes shark, swordfish, king mackerel, and marlin. It also includes orange roughy, bigeye tuna, and tilefish from the [place]. Instead, eat fish that is low in mercury. Choose canned light tuna, salmon, pollock, catfish, or shrimp.   Limit caffeine.  Things like coffee, tea, chocolate, and some sodas can contain caffeine. Caffeine can pass through breast milk to your baby. It may cause fussiness and sleep problems. Talk to your doctor about how much caffeine is safe for you.  Limit alcohol.  Alcohol can pass through breast milk to your baby. Talk to your doctor if you have questions about drinking alcohol while breastfeeding.  Be safe with supplements.  Talk with your doctor before taking any vitamins, minerals, and herbal or other dietary supplements.   When should you call for help?  Watch closely for changes in your health, and be sure to contact your doctor if you have any problems.  Where can you learn more?  Go to [employer] and enter P234 to learn more about "Nutrition for Breastfeeding: Care Instructions."  Current as of: May 05, 2022               Content Version: 14.0   2006-2024 [employer].   Care instructions adapted under license by [facility]. If you have questions about a medical condition or this instruction, always ask your healthcare professional. [employer] disclaims any warranty or liability for your use of this information.          Caring for Yourself After Cesarean Delivery (03:27)  Your health professional recommends that you watch this short online health video.  Learn ways to care for yourself  after giving birth.  Purpose:  Teaches postpartum care after cesarean delivery.  Goal:  Users will learn how to take good care of themselves after having a baby.     How to watch the video    Scan the QR code   OR Visit the website    https://hwi.se/[email or web address]   Current as of: February 22, 2022               Content Version: 14.0   2006-2024 [employer], [employer].   Care instructions adapted under license by [facility]. If you have questions about a medical condition or this instruction, always ask your healthcare professional. [employer] Inc[employer] disclaims any warranty or liability for your use of this information.

## 2023-04-20 NOTE — Progress Notes (Signed)
 [facility]  CNM Office Note    Krystal Bird is a 27 y.o. female who presents today for her medical conditions/ complaints as noted below.  Chief Complaint   Patient presents with    Postpartum Care       HPI  Krystal Bird presents for her 2 week post partum visit. Complains of fever post-op and pain. Was treated at [facility] for post-op infection. Currently taking antibiotics.          Visit Reason:  Post-Partum Visit       Baby's Name:  Edyth       Delivery Date:  04/05/23       Type of Delivery:  section       Feeding: breast       LMP:  07/27/22       Contraceptive Choices:        Last PAP:  never    Problems/Complaints today:  Patient Active Problem List   Diagnosis    Spells of decreased attentiveness    Anxiety    Other headache syndrome    Syncope, near    Dizzy spells    [redacted] weeks gestation of pregnancy    Fetal distress affecting care       Patient's last menstrual period was 07/27/2022 (exact date).  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 No pertinent family history.  Social History     Tobacco Use    Smoking status: Never    Smokeless tobacco: Not on file   Substance Use Topics    Alcohol use: No       Current Outpatient Medications   Medication Sig Dispense Refill    sertraline  (ZOLOFT ) 50 MG tablet Take 1 tablet by mouth in the morning and at bedtime 60 tablet 5    Prenat-B2-B6-B12-D3-FA (PRENA1 PO) Take by mouth       No current facility-administered medications for this visit.     No Known Allergies  Vitals:    04/20/23 1406   BP: 131/75   Pulse: 62   Weight: 92.5 kg (204 lb)   Height: 1.676 m (5' 6)     Body mass index is 32.93 kg/m.    A comprehensive review of systems was negative except for what was noted in the HPI.     Physical  Exam  Constitutional:       Appearance: Normal appearance. She is well-developed and well-groomed.   HENT:      Head: Normocephalic and atraumatic.      Right Ear: Hearing normal.      Nose: Nose normal.      Mouth/Throat:      Lips: Pink.   Eyes:      Conjunctiva/sclera: Conjunctivae normal.      Pupils: Pupils are equal, round, and reactive to light.   Abdominal:          Comments: Incision clean, dry, and intact. Small hematoma noted midline.      Musculoskeletal:      Cervical back: Normal range of motion and neck supple.   Neurological:      General: No focal deficit present.  Mental Status: She is alert and oriented to person, place, and time.   Skin:     General: Skin is warm and dry.   Psychiatric:         Attention and Perception: Attention and perception normal.         Mood and Affect: Mood and affect normal.         Speech: Speech normal.         Behavior: Behavior normal. Behavior is cooperative.         Thought Content: Thought content normal.          MEDICATIONS:  No orders of the defined types were placed in this encounter.      ORDERS:  No orders of the defined types were placed in this encounter.      PLAN:  1. Postpartum care and examination  2. S/P C-section - continue oral antibiotics for post op infection. Annual exam in 3-4 months.         I Janita Ohara, LPN, am scribing for and in the presence of Asberry Louder, [place]  04/20/23  2:42 PM CDT   I have seen and examined the patient independently.  I reviewed all laboratory and imaging studies that are relevant.  I have reviewed and made any appropriate changes to the HPI.    Electronically signed by Asberry Louder, APRN - CNM on 04/20/23  at 7:43 PM CDT

## 2023-04-20 NOTE — Progress Notes (Signed)
 The patient returns for her post-partum visit.  All information below was reviewed with her.    Visit Reason:  Post-Partum Visit       Baby's Name:  Edyth       Delivery Date:  04/05/23       Type of Delivery:  section       Feeding: breast       LMP:  07/27/22       Contraceptive Choices:        Last PAP:  never     Problems: pt c/o increased  pain since delivery and fever.

## 2023-05-19 NOTE — Patient Instructions (Signed)
 After Your Pregnancy: The First 12 Weeks (05:36)  Your health professional recommends that you watch this short online health video.  Find out what happens to your body after giving birth and why it can take time to feel like yourself again.  Purpose:  Explains what happens to the body after labor and delivery. Teaches what is normal recovery and when it may be time to call the doctor or midwife.  Goal:  Learn what happens to the body after labor and delivery. Teaches what is normal recovery and when it may be time to call the doctor or midwife.     How to watch the video    Scan the QR code   OR Visit the website    [email or web address]   Current as of: June 03, 2022               Content Version: 14.0   2006-2024 [employer].   Care instructions adapted under license by [facility]. If you have questions about a medical condition or this instruction, always ask your healthcare professional. [employer] disclaims any warranty or liability for your use of this information.       After Your Delivery (the Postpartum Period): Care Instructions  Overview     After childbirth (postpartum period), your body goes through many changes as you recover. In these weeks after delivery, try to take good care of yourself. Get rest whenever you can and accept help from others.  It may take 4 to 6 weeks to feel like yourself again, and possibly longer if you had a cesarean birth. You may feel sore or very tired as you recover. After delivery, you may continue to have contractions as the uterus returns to the size it was before your pregnancy. You will also have some vaginal bleeding. And you may have pain around the vagina as you heal. Several days after delivery you may also have pain and swelling in your breasts as they fill with milk. There are things you can do at home to help ease these discomforts.  After childbirth, it's common to feel emotional. You may feel irritable, cry  easily, and feel happy one minute and sad the next. This is called the "baby blues." Hormone changes are one cause of these emotional changes. These feelings usually get better within a couple of weeks. If they don't, talk to your doctor or midwife.  In the first couple of weeks after you give birth, your doctor or midwife may want to check in with you and make a plan for follow-up care. You will likely have a complete postpartum visit in the first 3 months after delivery. At that time, your doctor or midwife will check on your recovery and see how you're doing. But if you have questions or concerns before then, you can always call your doctor or midwife.  Follow-up care is a key part of your treatment and safety. Be sure to make and go to all appointments, and call your doctor if you are having problems. It's also a good idea to know your test results and keep a list of the medicines you take.  How can you care for yourself at home?  Taking care of your body  Use pads instead of tampons for bleeding. After birth, you will have bloody vaginal discharge. You may also pass some blood clots that shouldn't be bigger than an egg. Over the next 6 weeks or so, your bleeding should decrease a little every day  and slowly change to a pinkish and then whitish discharge.  For cramps or mild pain, try an over-the-counter pain medicine, such as acetaminophen (Tylenol) or ibuprofen (Advil, Motrin). Read and follow all instructions on the label.  To ease pain around the vagina or from hemorrhoids:  Put ice or a cold pack on the area for 10 to 20 minutes at a time. Put a thin cloth between the ice and your skin.  Try sitting in a few inches of warm water (sitz bath) when you can or after bowel movements.  Clean yourself with a gentle squeeze of warm water from a bottle instead of wiping with toilet paper.  Use witch hazel or hemorrhoid pads (such as Tucks).  Try using a cold compress for sore and swollen breasts. And wear a  supportive bra that fits.  Ease constipation by drinking plenty of fluids and eating high-fiber foods. Ask your doctor or midwife about over-the-counter stool softeners.  Activity  Rest when you can.  Ask for help from family or friends when you need it.  If you can, have another adult in your home for at least 2 or 3 days after birth.  When you feel ready, try to get some exercise every day. For many people, walking is a good choice. Don't do any heavy exercise until your doctor or midwife says it's okay.  Ask your doctor or midwife when it is okay to have vaginal sex.  If you don't want to get pregnant, talk to your doctor or midwife about birth control options. You can get pregnant even before your period returns. Also, you can get pregnant while you are breastfeeding.  Talk to your doctor or midwife if you want to get pregnant again. They can talk to you about when it is safe.  Emotional health  It's normal to have some sadness, anxiety, and mood swings after delivery. It may help to talk with a trusted friend or family member. You can also call the Maternal Mental Health Hotline at 1-833-TLC-MAMA ((443)215-7429) for support. If these mood changes last more than a couple of weeks, talk to your doctor or midwife.  When should you call for help?  Share this information with your partner, family, or a friend. They can help you watch for warning signs.  Call 911  anytime you think you may need emergency care. For example, call if:    You feel you cannot stop from hurting yourself, your baby, or someone else.     You passed out (lost consciousness).     You have chest pain, are short of breath, or cough up blood.     You have a seizure.   Where to get help 24 hours a day, 7 days a week   If you or someone you know talks about suicide, self-harm, a mental health crisis, a substance use crisis, or any other kind of emotional distress, get help right away. You can:    Call the Suicide and Crisis Lifeline at 81.      Call 1-800-273-TALK (857-395-9630).     Text HOME to (959)414-0709 to access the Crisis Text Line.   Consider saving these numbers in your phone.  Go to 988lifeline.org for more information or to chat online.  Call your doctor or midwife now or seek immediate medical care if:    You have signs of hemorrhage (too much bleeding), such as:  Heavy vaginal bleeding. This means that you are soaking through one or more pads in  an hour. Or you pass blood clots bigger than an egg.  Feeling dizzy or lightheaded, or you feel like you may faint.  Feeling so tired or weak that you cannot do your usual activities.  A fast or irregular heartbeat.  New or worse belly pain.     You have signs of infection, such as:  A fever.  Increased pain, swelling, warmth, or redness from an incision or wound.  Frequent or painful urination or blood in your urine.  Vaginal discharge that smells bad.  New or worse belly pain.     You have symptoms of a blood clot in your leg (called a deep vein thrombosis), such as:  Pain in the calf, back of the knee, thigh, or groin.  Swelling in the leg or groin.  A color change on the leg or groin. The skin may be reddish or purplish, depending on your usual skin color.     You have signs of preeclampsia, such as:  Sudden swelling of your face, hands, or feet.  New vision problems (such as dimness, blurring, or seeing spots).  A severe headache.     You have signs of heart failure, such as:  New or increased shortness of breath.  New or worse swelling in your legs, ankles, or feet.  Sudden weight gain, such as more than 2 to 3 pounds in a day or 5 pounds in a week.  Feeling so tired or weak that you cannot do your usual activities.     You had spinal or epidural pain relief and have:  New or worse back pain.  Increased pain, swelling, warmth, or redness at the injection site.  Tingling, weakness, or numbness in your legs or groin.   Watch closely for changes in your health, and be sure to contact your doctor or  midwife if:    Your vaginal bleeding isn't decreasing.     You feel sad, anxious, or hopeless for more than a few days.     You are having problems with your breasts or breastfeeding.   Where can you learn more?  Go to [email or web address] and enter A461 to learn more about "After Your Delivery (the Postpartum Period): Care Instructions."  Current as of: February 22, 2022               Content Version: 14.0   2006-2024 [employer].   Care instructions adapted under license by [facility]. If you have questions about a medical condition or this instruction, always ask your healthcare professional. [employer] disclaims any warranty or liability for your use of this information.       Signs of an Emergency After Childbirth: Care Instructions  If you have any of these symptoms after childbirth, it could be a sign of an emergency. Call emergency services or your doctor right away.   Call 911  if you have any of these symptoms.   Thoughts of harming yourself, your baby, or another person.  Passing out.  Chest pain.  Shortness of breath.  Coughing up blood.  A seizure.   Call the Suicide and Crisis Lifeline at 43 or (608)839-0368 if you have thoughts of suicide or self-harm.   Call your doctor or midwife now if you have any of these symptoms.    Sudden swelling of your face, hands, or feet.  New vision problems (such as dimness, blurring, or seeing spots).  A severe headache.  Heavy vaginal bleeding (soaking through one or more pads in  an hour, or passing blood clots bigger than an egg).  A fast or irregular heartbeat.  New or worse belly pain.  Feeling so tired or weak that you cannot do your usual activities.  Feeling dizzy or lightheaded.  Pain in your calf, back of the knee, thigh, or groin.  Swelling in your leg or groin.  A color change on the leg or groin.  A fever.  Redness on your breast.  Vaginal discharge that smells bad.  Frequent or painful urination or blood in your  urine.  Feeling sad, anxious, or hopeless for more than a few days.  Write down this information. Share it with emergency services or your doctor.      Date you gave birth:  Date and time your symptoms started:    Symptoms you are having:  Where can you learn more?  Go to [email or web address] and enter E565 to learn more about "Signs of an Emergency After Childbirth: Care Instructions."  Current as of: February 22, 2022               Content Version: 14.0   2006-2024 [employer].   Care instructions adapted under license by [facility]. If you have questions about a medical condition or this instruction, always ask your healthcare professional. [employer] disclaims any warranty or liability for your use of this information.       Postpartum Care at Home With Your Baby: Care Instructions  Overview     After childbirth (postpartum period), your body goes through many changes as you recover. In these weeks after delivery, try to take good care of yourself. Get rest whenever you can and accept help from others.  It may take 4 to 6 weeks to feel like yourself again, and possibly longer if you had a cesarean birth. You may feel sore or very tired as you recover. After delivery, you may continue to have contractions as the uterus returns to the size it was before your pregnancy. You will also have some vaginal bleeding. And you may have pain around the vagina as you heal. Several days after delivery you may also have pain and swelling in your breasts as they fill with milk. There are things you can do at home to help ease these discomforts.  After childbirth, it's common to feel emotional. You may feel irritable, cry easily, and feel happy one minute and sad the next. This is called the "baby blues." Hormone changes are one cause of these emotional changes. These feelings usually get better within a couple of weeks. If they don't, talk to your doctor or midwife.  In the first couple  of weeks after you give birth, your doctor or midwife may want to check in with you and make a plan for follow-up care. You will likely have a complete postpartum visit in the first 3 months after delivery. At that time, your doctor or midwife will check on your recovery and see how you're doing. But if you have questions or concerns before then, you can always call your doctor or midwife.  Follow-up care is a key part of your treatment and safety. Be sure to make and go to all appointments, and call your doctor if you are having problems. It's also a good idea to know your test results and keep a list of the medicines you take.  How can you care for yourself at home?  Taking care of your body  Use pads instead of tampons for bleeding.  After birth, you will have bloody vaginal discharge. You may also pass some blood clots that shouldn't be bigger than an egg. Over the next 6 weeks or so, your bleeding should decrease a little every day and slowly change to a pinkish and then whitish discharge.  For cramps or mild pain, try an over-the-counter pain medicine, such as acetaminophen (Tylenol) or ibuprofen (Advil, Motrin). Read and follow all instructions on the label.  To ease pain around the vagina or from hemorrhoids:  Put ice or a cold pack on the area for 10 to 20 minutes at a time. Put a thin cloth between the ice and your skin.  Try sitting in a few inches of warm water (sitz bath) when you can or after bowel movements.  Clean yourself with a gentle squeeze of warm water from a bottle instead of wiping with toilet paper.  Use witch hazel or hemorrhoid pads (such as Tucks).  Try using a cold compress for sore and swollen breasts. And wear a supportive bra that fits.  Ease constipation by drinking plenty of fluids and eating high-fiber foods. Ask your doctor or midwife about over-the-counter stool softeners.  Activity  Rest when you can.  Ask for help from family or friends when you need it.  If you can, have  another adult in your home for at least 2 or 3 days after birth.  When you feel ready, try to get some exercise every day. For many people, walking is a good choice. Don't do any heavy exercise until your doctor or midwife says it's okay.  Ask your doctor or midwife when it is okay to have vaginal sex.  If you don't want to get pregnant, talk to your doctor or midwife about birth control options. You can get pregnant even before your period returns. Also, you can get pregnant while you are breastfeeding.  Talk to your doctor or midwife if you want to get pregnant again. They can talk to you about when it is safe.  Emotional health  It's normal to have some sadness, anxiety, and mood swings after delivery. It may help to talk with a trusted friend or family member. You can also call the Maternal Mental Health Hotline at 1-833-TLC-MAMA (848-451-5540) for support. If these mood changes last more than a couple of weeks, talk to your doctor or midwife.  When should you call for help?  Share this information with your partner, family, or a friend. They can help you watch for warning signs.  Call 911  anytime you think you may need emergency care. For example, call if:    You feel you cannot stop from hurting yourself, your baby, or someone else.     You passed out (lost consciousness).     You have chest pain, are short of breath, or cough up blood.     You have a seizure.   Where to get help 24 hours a day, 7 days a week   If you or someone you know talks about suicide, self-harm, a mental health crisis, a substance use crisis, or any other kind of emotional distress, get help right away. You can:    Call the Suicide and Crisis Lifeline at 63.     Call 1-800-273-TALK (437 718 2434).     Text HOME to 929-403-4008 to access the Crisis Text Line.   Consider saving these numbers in your phone.  Go to 988lifeline.org for more information or to chat online.  Call your doctor or midwife  now or seek immediate medical care if:     You have signs of hemorrhage (too much bleeding), such as:  Heavy vaginal bleeding. This means that you are soaking through one or more pads in an hour. Or you pass blood clots bigger than an egg.  Feeling dizzy or lightheaded, or you feel like you may faint.  Feeling so tired or weak that you cannot do your usual activities.  A fast or irregular heartbeat.  New or worse belly pain.     You have signs of infection, such as:  A fever.  Increased pain, swelling, warmth, or redness from an incision or wound.  Frequent or painful urination or blood in your urine.  Vaginal discharge that smells bad.  New or worse belly pain.     You have symptoms of a blood clot in your leg (called a deep vein thrombosis), such as:  Pain in the calf, back of the knee, thigh, or groin.  Swelling in the leg or groin.  A color change on the leg or groin. The skin may be reddish or purplish, depending on your usual skin color.     You have signs of preeclampsia, such as:  Sudden swelling of your face, hands, or feet.  New vision problems (such as dimness, blurring, or seeing spots).  A severe headache.     You have signs of heart failure, such as:  New or increased shortness of breath.  New or worse swelling in your legs, ankles, or feet.  Sudden weight gain, such as more than 2 to 3 pounds in a day or 5 pounds in a week.  Feeling so tired or weak that you cannot do your usual activities.     You had spinal or epidural pain relief and have:  New or worse back pain.  Increased pain, swelling, warmth, or redness at the injection site.  Tingling, weakness, or numbness in your legs or groin.   Watch closely for changes in your health, and be sure to contact your doctor or midwife if:    Your vaginal bleeding isn't decreasing.     You feel sad, anxious, or hopeless for more than a few days.     You are having problems with your breasts or breastfeeding.   Where can you learn more?  Go to [employer] and enter Z768 to  learn more about "Postpartum Care at Home With Your Baby: Care Instructions."  Current as of: February 22, 2022               Content Version: 14.0   2006-2024 [employer], [employer].   Care instructions adapted under license by [facility]. If you have questions about a medical condition or this instruction, always ask your healthcare professional. [employer], Inc[employer] disclaims any warranty or liability for your use of this information.       Depression After Childbirth: Care Instructions  It's common to lose sleep, feel irritable, and cry easily during the first few days after childbirth. Hormone changes and the demands of a new baby can cause these "baby blues." If these mood changes last more than 2 weeks, you may have postpartum depression. This is a medical condition that requires treatment.  If you have any of these signs, you may be depressed. See your doctor right away.    You feel very sad or hopeless and lose interest in daily activities.       You sleep too much or not enough.  You feel tired or as if you have no energy.       You eat too much or too little.       You write or talk about death.     Tips to help with postpartum depression    What you can do    Try to go to all of your counseling sessions.  Take medicines as directed.  Eat healthy foods.  Get daily exercise, such as walks.  Try to get some sunlight every day.  Avoid using alcohol or other substances.  Get as much rest as possible.  Connect with friends, and join a support group for new parents.  When should you call for help?   Call 911 if:    You feel you cannot stop from hurting yourself, your baby, or someone else.   Where to get help 24 hours a day, 7 days a week   If you or someone you know talks about suicide, self-harm, a mental health crisis, a substance use crisis, or any other kind of emotional distress, get help right away. You can:    Call the Suicide and Crisis Lifeline at 33.     Call 1-800-273-TALK  ((660)447-2760).     Text HOME to 339-227-0682 to access the Crisis Text Line.   Consider saving these numbers in your phone.  Go to 988lifeline.org for more information or to chat online.  Call your doctor now or seek immediate medical care if:    You are having trouble caring for yourself or your baby.     You hear voices.   Contact your doctor if:    You have problems with your medicines.     You do not get better as expected.   Follow-up care is a key part of your treatment and safety. Be sure to make and go to all appointments, and call your doctor if you are having problems. It's also a good idea to know your test results and keep a list of the medicines you take.  Where can you learn more?  Go to [employer] and enter Y782 to learn more about "Depression After Childbirth: Care Instructions."  Current as of: February 06, 2022               Content Version: 14.0   2006-2024 [employer].   Care instructions adapted under license by [facility]. If you have questions about a medical condition or this instruction, always ask your healthcare professional. [employer] disclaims any warranty or liability for your use of this information.        Understanding Postpartum Depression (02:47)  Your health professional recommends that you watch this short online health video.  Learn about postpartum depression and how it's treated.  Purpose:  Explains how to recognize and treat postpartum depression.  Goal:  User will learn about postpartum depression and how it can be treated.     How to watch the video    Scan the QR code   OR Visit the website    [email or web address]   Current as of: February 06, 2022               Content Version: 14.0   2006-2024 [employer].   Care instructions adapted under license by [facility]. If you have questions about a medical condition or this instruction, always ask your healthcare professional. [employer]  disclaims any warranty or liability for your use of this information.  Learning About Birth Control After Childbirth  What is birth control?  Birth control is any method used to prevent pregnancy. If you have vaginal sex without birth control, you could get pregnant--even if you haven't started having periods again. You're less likely to get pregnant while breastfeeding, but it's still possible. Finding birth control that works for you can help avoid an unplanned pregnancy.  There are many kinds of birth control. Each has pros and cons. Find what works for you. Talk to your doctor if you've just given birth or are breastfeeding.    Long-acting reversible contraception (LARC). These are placed inside your body by a doctor. They can prevent pregnancy for years.  Examples include:  An implant (hormonal).  Copper intrauterine device (IUD).  Hormonal IUDs.    Short-acting hormonal methods. These release hormones. Examples include:  Combination birth control pills ("the pill").  Skin patches.  A vaginal ring.  A shot.  Mini-pills. Choose progestin-only options soon after giving birth.    Barrier methods. Use these every time you have vaginal sex.  Examples include:  External (female) condoms.  Internal (female) condoms.  Diaphragms.  Cervical caps.  Sponges.    Spermicides. These kill sperm or stop sperm from moving. They can be gels, creams, foams, films, or tablets. Use them before vaginal sex.  Examples include:  Nonoxynol-9.  pH regulator gel.    Permanent birth control (sterilization). This can be an option if you're sure that you don't want to get pregnant later.  Examples include:  Vasectomy.  Having tubes tied (tubal ligation).    Emergency contraception. This is a backup method. Use it if you didn't use birth control or your birth control method failed.  Examples include:  Copper and hormonal IUDs.  Emergency contraceptive pills.    Fertility awareness. You'll learn when you are most likely to become pregnant  (are fertile). You can avoid vaginal sex at that time.  It's also called:  Natural family planning.  The rhythm method.    Breastfeeding. This is most effective when all of these are true:  Your baby is younger than 52 months old.  You're breastfeeding and not bottle-feeding at all.  You aren't having periods.  Follow-up care is a key part of your treatment and safety. Be sure to make and go to all appointments, and call your doctor if you are having problems. It's also a good idea to know your test results and keep a list of the medicines you take.  Where can you learn more?  Go to [employer] and enter X408 to learn more about "Learning About Birth Control After Childbirth."  Current as of: February 22, 2022               Content Version: 14.0   2006-2024 [employer].   Care instructions adapted under license by [facility]. If you have questions about a medical condition or this instruction, always ask your healthcare professional. [employer] Inc[employer] disclaims any warranty or liability for your use of this information.       Nutrition for Breastfeeding: Care Instructions  Overview     Eating well during breastfeeding helps you stay healthy. Eat a variety of grains, vegetables, fruits, dairy or dairy alternatives, and protein foods. Avoid fish high in mercury. And limit alcohol and caffeine. Your doctor or midwife may suggest eating more calories each day than otherwise recommended for a person of your height and weight.  Follow-up care is a key part of your treatment  and safety. Be sure to make and go to all appointments, and call your doctor if you are having problems. It's also a good idea to know your test results and keep a list of the medicines you take.  How can you care for yourself at home?  Making good choices about what you eat and drink when you are breastfeeding can help you stay healthy. It can also help your baby stay healthy. Here are some things you can  do.  Eat a variety of healthy foods.  This includes vegetables, fruits, milk products, whole grains, and protein.  Drink plenty of fluids.  Make water your first choice. If you have kidney, heart, or liver disease and have to limit fluids, talk with your doctor before you increase your fluids.  Avoid fish with high mercury.  This includes shark, swordfish, king mackerel, and marlin. It also includes orange roughy, bigeye tuna, and tilefish from the [place]. Instead, eat fish that is low in mercury. Choose canned light tuna, salmon, pollock, catfish, or shrimp.   Limit caffeine.  Things like coffee, tea, chocolate, and some sodas can contain caffeine. Caffeine can pass through breast milk to your baby. It may cause fussiness and sleep problems. Talk to your doctor about how much caffeine is safe for you.  Limit alcohol.  Alcohol can pass through breast milk to your baby. Talk to your doctor if you have questions about drinking alcohol while breastfeeding.  Be safe with supplements.  Talk with your doctor before taking any vitamins, minerals, and herbal or other dietary supplements.   When should you call for help?  Watch closely for changes in your health, and be sure to contact your doctor if you have any problems.  Where can you learn more?  Go to [employer] and enter P234 to learn more about "Nutrition for Breastfeeding: Care Instructions."  Current as of: May 05, 2022               Content Version: 14.0   2006-2024 [employer].   Care instructions adapted under license by [facility]. If you have questions about a medical condition or this instruction, always ask your healthcare professional. [employer] disclaims any warranty or liability for your use of this information.          Caring for Yourself After Cesarean Delivery (03:27)  Your health professional recommends that you watch this short online health video.  Learn ways to care for yourself  after giving birth.  Purpose:  Teaches postpartum care after cesarean delivery.  Goal:  Users will learn how to take good care of themselves after having a baby.     How to watch the video    Scan the QR code   OR Visit the website    https://hwi.se/[email or web address]   Current as of: February 22, 2022               Content Version: 14.0   2006-2024 [employer], [employer].   Care instructions adapted under license by [facility]. If you have questions about a medical condition or this instruction, always ask your healthcare professional. [employer] Inc[employer] disclaims any warranty or liability for your use of this information.

## 2023-05-23 ENCOUNTER — Encounter: Admit: 2023-05-23 | Discharge: 2023-05-23 | Payer: BLUE CROSS/BLUE SHIELD | Attending: Women's Health

## 2023-05-23 NOTE — Progress Notes (Signed)
The patient returns for her post-partum visit.  All information below was reviewed with her.    Visit Reason:  Post-Partum Visit       Baby's Name:  Thurston Hole       Delivery Date:  04/05/23       Type of Delivery: section        Feeding: pumping       LMP:  07/27/22       Contraceptive Choices:        Last PAP:  never        Depression: 4

## 2023-05-23 NOTE — Progress Notes (Signed)
[facility]  CNM Office Note    Krystal Bird is a 27 y.o. female who presents today for her medical conditions/ complaints as noted below.  Chief Complaint   Patient presents with    Postpartum Care       HPI  Krystal Bird presents for her 6 week post partum visit. Denies pain. No bleeding. Mood is good. Has "severe anxiety" when it comes to leaving baby.          Visit Reason:  Post-Partum Visit       Baby's Name:  Krystal Bird       Delivery Date:  04/05/23       Type of Delivery: section        Feeding: pumping       LMP:  07/27/22       Contraceptive Choices:        Last PAP:  never        Depression: 4    Problems/Complaints today:  Patient Active Problem List   Diagnosis    Spells of decreased attentiveness    Anxiety    Other headache syndrome    Syncope, near    Dizzy spells    Fetal distress affecting care       No LMP recorded (lmp unknown).  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 No pertinent family history.  Social History     Tobacco Use    Smoking status: Never    Smokeless tobacco: Not on file   Substance Use Topics    Alcohol use: No       Current Outpatient Medications   Medication Sig Dispense Refill    sertraline (ZOLOFT) 50 MG tablet Take 1 tablet by mouth in the morning and at bedtime 60 tablet 5     No current facility-administered medications for this visit.     No Known Allergies  Vitals:    05/23/23 0941   BP: 118/67   Pulse: 51   Weight: 94.8 kg (209 lb)     Body mass index is 33.73 kg/m.    A comprehensive review of systems was negative except for what was noted in the HPI.     Physical Exam  Constitutional:       Appearance: Normal appearance. She is well-developed and well-groomed.   HENT:      Head: Normocephalic and atraumatic.       Right Ear: Hearing normal.      Nose: Nose normal.      Mouth/Throat:      Lips: Pink.   Eyes:      Conjunctiva/sclera: Conjunctivae normal.      Pupils: Pupils are equal, round, and reactive to light.   Musculoskeletal:      Cervical back: Normal range of motion and neck supple.   Neurological:      General: No focal deficit present.      Mental Status: She is alert and oriented to person, place, and time.   Skin:     General: Skin is warm and dry.   Psychiatric:         Attention and Perception: Attention and perception normal.  Mood and Affect: Mood and affect normal.         Speech: Speech normal.         Behavior: Behavior normal. Behavior is cooperative.         Thought Content: Thought content normal.          MEDICATIONS:  No orders of the defined types were placed in this encounter.      ORDERS:  No orders of the defined types were placed in this encounter.      PLAN:  1. 6 weeks postpartum follow-up  2. S/P C-section  3. Postpartum care and examination  4. Encounter for screening for maternal depression  5. Postpartum anxiety  In depth discussion on separation anxiety.    I Edison Simon, LPN, am scribing for and in the presence of Antony Salmon, [place]  05/23/23  9:59 AM CDT   I have seen and examined the patient independently.  I reviewed all laboratory and imaging studies that are relevant.  I have reviewed and made any appropriate changes to the HPI.    Electronically signed by Antony Salmon, APRN - CNM on 05/23/23  at 12:29 PM CDT

## 2023-09-27 ENCOUNTER — Ambulatory Visit: Admit: 2023-09-27 | Discharge: 2023-09-27 | Payer: PRIVATE HEALTH INSURANCE | Attending: Family

## 2023-09-27 VITALS — BP 127/84 | HR 51 | Ht 66.0 in | Wt 213.0 lb

## 2023-09-27 DIAGNOSIS — Z01419 Encounter for gynecological examination (general) (routine) without abnormal findings: Secondary | ICD-10-CM

## 2023-09-27 NOTE — Progress Notes (Signed)
Krystal Bird is a 28 y.o. female who presents today for her medical conditions/ complaints as noted below. Terresa Marlett is c/o of Annual Exam        HPI  Pt presents today for pap smear and breast exam.  Pt complains of more cramping than before being pregnant along with vaginal bleeding associated with period lasting about 2 weeks. Is still breastfeeding  Not interested in birth control. Going to start trying to conceive in few months    Mammo: never   Pap smear: never   Contraception: none   G: 1  P: 1  Ab: 0  Bone density: never  Colonoscopy: never   Patient's last menstrual period was 09/13/2023.  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 No pertinent family history.  Social History     Tobacco Use    Smoking status: Never    Smokeless tobacco: Never   Substance Use Topics    Alcohol use: No       Current Outpatient Medications   Medication Sig Dispense Refill    sertraline (ZOLOFT) 100 MG tablet Take 1 tablet by mouth daily      thiamine 100 MG tablet Take 1 tablet by mouth daily      magnesium oxide (MAG-OX) 400 MG tablet Take 1 tablet by mouth daily      FEROSUL 325 (65 Fe) MG tablet Take 1 tablet by mouth daily (with breakfast)      cholecalciferol (VITAMIN D, CHOLECALCIFEROL,) 400 UNIT TABS tablet Take 2 tablets by mouth daily      sertraline (ZOLOFT) 50 MG tablet Take 1 tablet by mouth in the morning and at bedtime 60 tablet 5     No current facility-administered medications for this visit.     No Known Allergies  Vitals:    09/27/23 1043   BP: 127/84   Pulse: 51     Body mass index is 34.38 kg/m.    Review of Systems   Constitutional: Negative.    HENT: Negative.     Eyes: Negative.    Respiratory: Negative.     Cardiovascular: Negative.     Gastrointestinal: Negative.    Endocrine: Negative.    Genitourinary:  Positive for menstrual problem. Negative for difficulty urinating, dyspareunia, dysuria, enuresis, frequency, hematuria, pelvic pain, urgency and vaginal discharge.   Musculoskeletal: Negative.    Skin: Negative.    Allergic/Immunologic: Negative.    Neurological: Negative.    Hematological: Negative.    Psychiatric/Behavioral: Negative.           Physical Exam  Vitals and nursing note reviewed.   Constitutional:       General: She is not in acute distress.     Appearance: She is well-developed. She is not diaphoretic.   HENT:      Head: Normocephalic and atraumatic.      Right Ear: External ear normal.      Left Ear: External ear normal.      Nose: Nose normal.      Mouth/Throat:      Mouth: Mucous membranes are moist.      Pharynx: Oropharynx is clear.   Eyes:  General: Lids are normal.         Right eye: No discharge.         Left eye: No discharge.      Conjunctiva/sclera: Conjunctivae normal.      Pupils: Pupils are equal, round, and reactive to light.   Neck:      Thyroid: No thyroid mass or thyromegaly.      Trachea: No tracheal deviation.   Cardiovascular:      Rate and Rhythm: Normal rate and regular rhythm.      Heart sounds: Normal heart sounds. No murmur heard.  Pulmonary:      Effort: Pulmonary effort is normal.      Breath sounds: Normal breath sounds. No wheezing.   Chest:   Breasts:     Breasts are symmetrical.      Right: No inverted nipple, mass, nipple discharge, skin change or tenderness.      Left: No inverted nipple, mass, nipple discharge, skin change or tenderness.   Abdominal:      General: Bowel sounds are normal. There is no distension.      Palpations: Abdomen is soft. There is no mass.      Tenderness: There is no abdominal tenderness.      Hernia: There is no hernia in the left inguinal area or right inguinal area.   Genitourinary:     General: Normal vulva.      Labia:         Right: No rash, tenderness,  lesion or injury.         Left: No rash, tenderness, lesion or injury.       Urethra: No prolapse, urethral pain, urethral swelling or urethral lesion.      Vagina: Normal. No vaginal discharge or tenderness.      Cervix: Normal. Discharge: normal cervical mucosa.      Uterus: Normal. Not enlarged and not tender.       Adnexa: Right adnexa normal and left adnexa normal.        Right: No mass, tenderness or fullness.          Left: No mass, tenderness or fullness.        Rectum: Normal. No mass, anal fissure or external hemorrhoid.      Comments: Pap collected for cervical cytology  Musculoskeletal:         General: No tenderness. Normal range of motion.      Cervical back: Normal range of motion and neck supple.   Lymphadenopathy:      Cervical:      Right cervical: No superficial, deep or posterior cervical adenopathy.     Left cervical: No superficial, deep or posterior cervical adenopathy.      Upper Body:      Right upper body: No supraclavicular, pectoral or epitrochlear adenopathy.      Left upper body: No supraclavicular, pectoral or epitrochlear adenopathy.      Lower Body: No right inguinal adenopathy. No left inguinal adenopathy.   Skin:     General: Skin is warm and dry.      Capillary Refill: Capillary refill takes 2 to 3 seconds.      Findings: No rash.   Neurological:      Mental Status: She is alert and oriented to person, place, and time. She is not disoriented.      Sensory: No sensory deficit.      Coordination: Coordination normal.      Gait: Gait normal.  Deep Tendon Reflexes: Reflexes are normal and symmetric.   Psychiatric:         Speech: Speech normal.         Behavior: Behavior normal.         Thought Content: Thought content normal.         Judgment: Judgment normal.           Diagnosis Orders   1. Well woman exam with routine gynecological exam        2. Screening for cervical cancer  PAP SMEAR      3. Dysmenorrhea        4. Abnormal uterine bleeding (AUB)            MEDICATIONS:  No  orders of the defined types were placed in this encounter.      ORDERS:  Orders Placed This Encounter   Procedures    PAP SMEAR       PLAN:  Pap collected  Call if has worsening issues with regularity or trouble conceiving  There are no Patient Instructions on file for this visit.
